# Patient Record
Sex: Female | Born: 1950 | Race: White | Hispanic: No | Smoking: Current every day smoker
Health system: Southern US, Community
[De-identification: ages and names within clinical notes are randomized; demographics above are authoritative.]

## PROBLEM LIST (undated history)

## (undated) DIAGNOSIS — E785 Hyperlipidemia, unspecified: Secondary | ICD-10-CM

## (undated) DIAGNOSIS — K5790 Diverticulosis of intestine, part unspecified, without perforation or abscess without bleeding: Secondary | ICD-10-CM

## (undated) DIAGNOSIS — I10 Essential (primary) hypertension: Secondary | ICD-10-CM

## (undated) DIAGNOSIS — E0500 Thyrotoxicosis with diffuse goiter without thyrotoxic crisis or storm: Secondary | ICD-10-CM

## (undated) DIAGNOSIS — K635 Polyp of colon: Secondary | ICD-10-CM

## (undated) DIAGNOSIS — Z87442 Personal history of urinary calculi: Secondary | ICD-10-CM

## (undated) DIAGNOSIS — K633 Ulcer of intestine: Secondary | ICD-10-CM

## (undated) DIAGNOSIS — N809 Endometriosis, unspecified: Secondary | ICD-10-CM

## (undated) DIAGNOSIS — B029 Zoster without complications: Secondary | ICD-10-CM

## (undated) DIAGNOSIS — E039 Hypothyroidism, unspecified: Secondary | ICD-10-CM

## (undated) DIAGNOSIS — K219 Gastro-esophageal reflux disease without esophagitis: Secondary | ICD-10-CM

## (undated) DIAGNOSIS — E119 Type 2 diabetes mellitus without complications: Secondary | ICD-10-CM

## (undated) DIAGNOSIS — D510 Vitamin B12 deficiency anemia due to intrinsic factor deficiency: Secondary | ICD-10-CM

## (undated) DIAGNOSIS — M8580 Other specified disorders of bone density and structure, unspecified site: Secondary | ICD-10-CM

## (undated) DIAGNOSIS — G43909 Migraine, unspecified, not intractable, without status migrainosus: Secondary | ICD-10-CM

## (undated) DIAGNOSIS — N200 Calculus of kidney: Secondary | ICD-10-CM

## (undated) DIAGNOSIS — F419 Anxiety disorder, unspecified: Secondary | ICD-10-CM

## (undated) HISTORY — PX: OTHER SURGICAL HISTORY: SHX169

## (undated) HISTORY — DX: Migraine, unspecified, not intractable, without status migrainosus: G43.909

## (undated) HISTORY — DX: Other specified disorders of bone density and structure, unspecified site: M85.80

## (undated) HISTORY — DX: Endometriosis, unspecified: N80.9

## (undated) HISTORY — PX: ABDOMINAL HYSTERECTOMY: SHX81

## (undated) HISTORY — DX: Gastro-esophageal reflux disease without esophagitis: K21.9

## (undated) HISTORY — DX: Vitamin B12 deficiency anemia due to intrinsic factor deficiency: D51.0

## (undated) HISTORY — PX: ROTATOR CUFF REPAIR: SHX139

## (undated) HISTORY — DX: Zoster without complications: B02.9

## (undated) HISTORY — DX: Ulcer of intestine: K63.3

## (undated) HISTORY — DX: Diverticulosis of intestine, part unspecified, without perforation or abscess without bleeding: K57.90

## (undated) HISTORY — DX: Anxiety disorder, unspecified: F41.9

## (undated) HISTORY — DX: Hyperlipidemia, unspecified: E78.5

## (undated) HISTORY — PX: EYE MUSCLE SURGERY: SHX370

## (undated) HISTORY — DX: Polyp of colon: K63.5

## (undated) HISTORY — PX: LITHOTRIPSY: SUR834

---

## 2000-08-14 ENCOUNTER — Encounter: Admission: RE | Admit: 2000-08-14 | Discharge: 2000-08-14 | Payer: Self-pay | Admitting: Family Medicine

## 2000-08-14 ENCOUNTER — Encounter: Payer: Self-pay | Admitting: Family Medicine

## 2000-08-16 ENCOUNTER — Encounter: Payer: Self-pay | Admitting: Family Medicine

## 2000-08-16 ENCOUNTER — Encounter: Admission: RE | Admit: 2000-08-16 | Discharge: 2000-08-16 | Payer: Self-pay | Admitting: Family Medicine

## 2001-02-25 ENCOUNTER — Encounter (INDEPENDENT_AMBULATORY_CARE_PROVIDER_SITE_OTHER): Payer: Self-pay | Admitting: *Deleted

## 2001-02-25 ENCOUNTER — Encounter: Payer: Self-pay | Admitting: Emergency Medicine

## 2001-02-25 ENCOUNTER — Emergency Department (HOSPITAL_COMMUNITY): Admission: EM | Admit: 2001-02-25 | Discharge: 2001-02-25 | Payer: Self-pay | Admitting: Emergency Medicine

## 2001-03-21 ENCOUNTER — Ambulatory Visit (HOSPITAL_COMMUNITY): Admission: RE | Admit: 2001-03-21 | Discharge: 2001-03-21 | Payer: Self-pay | Admitting: Family Medicine

## 2001-03-21 ENCOUNTER — Encounter (INDEPENDENT_AMBULATORY_CARE_PROVIDER_SITE_OTHER): Payer: Self-pay | Admitting: *Deleted

## 2001-03-21 ENCOUNTER — Encounter: Payer: Self-pay | Admitting: Family Medicine

## 2002-06-09 ENCOUNTER — Encounter: Admission: RE | Admit: 2002-06-09 | Discharge: 2002-09-07 | Payer: Self-pay | Admitting: Orthopedic Surgery

## 2002-10-23 ENCOUNTER — Encounter: Payer: Self-pay | Admitting: Family Medicine

## 2002-10-23 ENCOUNTER — Encounter (INDEPENDENT_AMBULATORY_CARE_PROVIDER_SITE_OTHER): Payer: Self-pay | Admitting: *Deleted

## 2002-10-23 ENCOUNTER — Ambulatory Visit (HOSPITAL_COMMUNITY): Admission: RE | Admit: 2002-10-23 | Discharge: 2002-10-23 | Payer: Self-pay | Admitting: Family Medicine

## 2002-11-19 ENCOUNTER — Other Ambulatory Visit: Admission: RE | Admit: 2002-11-19 | Discharge: 2002-11-19 | Payer: Self-pay | Admitting: Family Medicine

## 2003-12-03 ENCOUNTER — Emergency Department (HOSPITAL_COMMUNITY): Admission: EM | Admit: 2003-12-03 | Discharge: 2003-12-03 | Payer: Self-pay | Admitting: Emergency Medicine

## 2004-12-20 ENCOUNTER — Other Ambulatory Visit: Admission: RE | Admit: 2004-12-20 | Discharge: 2004-12-20 | Payer: Self-pay | Admitting: Family Medicine

## 2008-05-09 ENCOUNTER — Emergency Department: Payer: Self-pay | Admitting: Emergency Medicine

## 2009-07-17 ENCOUNTER — Encounter (INDEPENDENT_AMBULATORY_CARE_PROVIDER_SITE_OTHER): Payer: Self-pay | Admitting: *Deleted

## 2009-07-17 ENCOUNTER — Encounter: Payer: Self-pay | Admitting: Internal Medicine

## 2009-07-17 ENCOUNTER — Encounter: Admission: RE | Admit: 2009-07-17 | Discharge: 2009-07-17 | Payer: Self-pay | Admitting: Family Medicine

## 2009-07-21 ENCOUNTER — Ambulatory Visit: Payer: Self-pay | Admitting: Internal Medicine

## 2009-07-21 DIAGNOSIS — E079 Disorder of thyroid, unspecified: Secondary | ICD-10-CM | POA: Insufficient documentation

## 2009-07-21 DIAGNOSIS — E0500 Thyrotoxicosis with diffuse goiter without thyrotoxic crisis or storm: Secondary | ICD-10-CM | POA: Insufficient documentation

## 2009-07-21 DIAGNOSIS — I10 Essential (primary) hypertension: Secondary | ICD-10-CM | POA: Insufficient documentation

## 2009-07-21 DIAGNOSIS — R197 Diarrhea, unspecified: Secondary | ICD-10-CM | POA: Insufficient documentation

## 2009-07-21 DIAGNOSIS — R143 Flatulence: Secondary | ICD-10-CM

## 2009-07-21 DIAGNOSIS — R142 Eructation: Secondary | ICD-10-CM

## 2009-07-21 DIAGNOSIS — R1031 Right lower quadrant pain: Secondary | ICD-10-CM

## 2009-07-21 DIAGNOSIS — R141 Gas pain: Secondary | ICD-10-CM

## 2009-07-21 DIAGNOSIS — E785 Hyperlipidemia, unspecified: Secondary | ICD-10-CM | POA: Insufficient documentation

## 2009-07-21 DIAGNOSIS — Z87442 Personal history of urinary calculi: Secondary | ICD-10-CM

## 2009-07-23 ENCOUNTER — Ambulatory Visit (HOSPITAL_COMMUNITY): Admission: RE | Admit: 2009-07-23 | Discharge: 2009-07-23 | Payer: Self-pay | Admitting: Internal Medicine

## 2009-07-23 ENCOUNTER — Encounter: Payer: Self-pay | Admitting: Internal Medicine

## 2009-07-23 HISTORY — PX: COLONOSCOPY: SHX174

## 2009-07-26 ENCOUNTER — Telehealth: Payer: Self-pay | Admitting: Internal Medicine

## 2009-07-26 ENCOUNTER — Encounter: Payer: Self-pay | Admitting: Internal Medicine

## 2009-08-19 ENCOUNTER — Ambulatory Visit: Payer: Self-pay | Admitting: Internal Medicine

## 2009-08-19 DIAGNOSIS — B029 Zoster without complications: Secondary | ICD-10-CM | POA: Insufficient documentation

## 2009-08-19 DIAGNOSIS — D126 Benign neoplasm of colon, unspecified: Secondary | ICD-10-CM

## 2010-09-05 ENCOUNTER — Other Ambulatory Visit: Payer: Self-pay

## 2013-04-14 ENCOUNTER — Ambulatory Visit: Payer: 59 | Attending: Sports Medicine | Admitting: Physical Therapy

## 2013-04-14 DIAGNOSIS — IMO0001 Reserved for inherently not codable concepts without codable children: Secondary | ICD-10-CM | POA: Insufficient documentation

## 2013-04-14 DIAGNOSIS — M545 Low back pain, unspecified: Secondary | ICD-10-CM | POA: Insufficient documentation

## 2013-04-14 DIAGNOSIS — M25559 Pain in unspecified hip: Secondary | ICD-10-CM | POA: Insufficient documentation

## 2013-04-16 ENCOUNTER — Ambulatory Visit: Payer: 59 | Attending: Sports Medicine | Admitting: Physical Therapy

## 2013-04-16 DIAGNOSIS — M25559 Pain in unspecified hip: Secondary | ICD-10-CM | POA: Insufficient documentation

## 2013-04-16 DIAGNOSIS — IMO0001 Reserved for inherently not codable concepts without codable children: Secondary | ICD-10-CM | POA: Insufficient documentation

## 2013-04-16 DIAGNOSIS — M545 Low back pain, unspecified: Secondary | ICD-10-CM | POA: Insufficient documentation

## 2013-11-13 ENCOUNTER — Other Ambulatory Visit: Payer: Self-pay | Admitting: Urology

## 2013-11-13 ENCOUNTER — Other Ambulatory Visit (HOSPITAL_COMMUNITY): Payer: Self-pay | Admitting: Urology

## 2013-11-13 ENCOUNTER — Encounter (HOSPITAL_COMMUNITY): Payer: Self-pay | Admitting: Pharmacy Technician

## 2013-11-13 NOTE — Patient Instructions (Addendum)
20 Heather Bentley  11/13/2013   Your procedure is scheduled on: Monday February 2nd  Report to [place] at 530 AM.  Call this number if you have problems the morning of surgery [phone]   Remember: NO VISITORS UNDER AGE 63 PER [place].   Do not eat food or drink liquids :After Midnight.     Take these medicines the morning of surgery with A SIP OF WATER: synthroid, eye drop, nicoderm patch                                SEE [place] PREPARING FOR SURGERY SHEET             You may not have any metal on your body including hair pins and piercings  Do not wear jewelry, make-up.  Do not wear lotions, powders, or perfumes. No  Deodorant is to be worn.   Men may shave face and neck.  Do not bring valuables to the hospital. [place].  Contacts, dentures or bridgework may not be worn into surgery.  Leave suitcase in the car. After surgery it may be brought to your room.  For patients admitted to the hospital, checkout time is 11:00 AM the day of discharge.   Patients discharged the day of surgery will not be allowed to drive home.  Name and phone number of your driver: [name] spouse cell [phone]  Special Instructions: N/A  Please read over the following fact sheets that you were given: [facility] Preparing for surgery sheet  Call [name] RN pre op nurse if needed [phone]    [place].  PATIENT SIGNATURE___________________________________________  NURSE SIGNATURE_____________________________________________

## 2013-11-14 ENCOUNTER — Encounter (HOSPITAL_COMMUNITY): Payer: Self-pay

## 2013-11-14 ENCOUNTER — Ambulatory Visit (HOSPITAL_COMMUNITY)
Admission: RE | Admit: 2013-11-14 | Discharge: 2013-11-14 | Disposition: A | Discharge status: Home / Self Care | Payer: 59 | Source: Ambulatory Visit | Attending: Urology | Admitting: Urology

## 2013-11-14 ENCOUNTER — Encounter (HOSPITAL_COMMUNITY)
Admission: RE | Admit: 2013-11-14 | Discharge: 2013-11-14 | Disposition: A | Discharge status: Home / Self Care | Payer: 59 | Source: Ambulatory Visit | Attending: Urology | Admitting: Urology

## 2013-11-14 DIAGNOSIS — Z01818 Encounter for other preprocedural examination: Secondary | ICD-10-CM | POA: Insufficient documentation

## 2013-11-14 DIAGNOSIS — Z01810 Encounter for preprocedural cardiovascular examination: Secondary | ICD-10-CM | POA: Insufficient documentation

## 2013-11-14 DIAGNOSIS — Z01812 Encounter for preprocedural laboratory examination: Secondary | ICD-10-CM | POA: Insufficient documentation

## 2013-11-14 HISTORY — DX: Hypothyroidism, unspecified: E03.9

## 2013-11-14 HISTORY — DX: Type 2 diabetes mellitus without complications: E11.9

## 2013-11-14 HISTORY — DX: Thyrotoxicosis with diffuse goiter without thyrotoxic crisis or storm: E05.00

## 2013-11-14 HISTORY — DX: Essential (primary) hypertension: I10

## 2013-11-14 HISTORY — DX: Personal history of urinary calculi: Z87.442

## 2013-11-14 HISTORY — DX: Calculus of kidney: N20.0

## 2013-11-14 LAB — CBC
HCT: 42.4 % (ref 36.0–46.0)
Hemoglobin: 14 g/dL (ref 12.0–15.0)
MCH: 30 pg (ref 26.0–34.0)
MCHC: 33 g/dL (ref 30.0–36.0)
MCV: 90.8 fL (ref 78.0–100.0)
Platelets: 399 10*3/uL (ref 150–400)
RBC: 4.67 MIL/uL (ref 3.87–5.11)
RDW: 14 % (ref 11.5–15.5)
WBC: 9.7 10*3/uL (ref 4.0–10.5)

## 2013-11-14 LAB — BASIC METABOLIC PANEL
BUN: 19 mg/dL (ref 6–23)
CO2: 27 mEq/L (ref 19–32)
Calcium: 9.5 mg/dL (ref 8.4–10.5)
Chloride: 97 mEq/L (ref 96–112)
Creatinine, Ser: 0.46 mg/dL — ABNORMAL LOW (ref 0.50–1.10)
GFR calc Af Amer: >90 mL/min (ref >90)
GFR calc non Af Amer: >90 mL/min (ref >90)
Glucose, Bld: 96 mg/dL (ref 70–99)
Potassium: 3.7 mEq/L (ref 3.7–5.3)
Sodium: 140 mEq/L (ref 137–147)

## 2013-11-16 NOTE — Anesthesia Preprocedure Evaluation (Addendum)
Anesthesia Evaluation  Patient identified by MRN, date of birth, ID band Patient awake    Reviewed: Allergy & Precautions, H&P , NPO status , Patient's Chart, lab work & pertinent test results  Airway Mallampati: II TM Distance: >3 FB Neck ROM: full    Dental  (+) Edentulous Upper and Dental Advisory Given   Pulmonary neg pulmonary ROS, Current Smoker,  breath sounds clear to auscultation  Pulmonary exam normal       Cardiovascular Exercise Tolerance: Good hypertension, Pt. on medications Rhythm:regular Rate:Normal     Neuro/Psych negative neurological ROS  negative psych ROS   GI/Hepatic negative GI ROS, Neg liver ROS,   Endo/Other  diabetes, Well Controlled, Type 2Hypothyroidism Graves disease.  Diet controlled DM  Renal/GU negative Renal ROS  negative genitourinary   Musculoskeletal   Abdominal   Peds  Hematology negative hematology ROS (+)   Anesthesia Other Findings   Reproductive/Obstetrics negative OB ROS                         Anesthesia Physical Anesthesia Plan  ASA: III  Anesthesia Plan: General   Post-op Pain Management:    Induction: Intravenous  Airway Management Planned: LMA  Additional Equipment:   Intra-op Plan:   Post-operative Plan:   Informed Consent: I have reviewed the patients History and Physical, chart, labs and discussed the procedure including the risks, benefits and alternatives for the proposed anesthesia with the patient or authorized representative who has indicated his/her understanding and acceptance.   Dental Advisory Given  Plan Discussed with: CRNA and Surgeon  Anesthesia Plan Comments:         Anesthesia Quick Evaluation

## 2013-11-17 ENCOUNTER — Ambulatory Visit (HOSPITAL_COMMUNITY): Payer: 59

## 2013-11-17 ENCOUNTER — Ambulatory Visit (HOSPITAL_COMMUNITY): Payer: 59 | Admitting: Anesthesiology

## 2013-11-17 ENCOUNTER — Encounter (HOSPITAL_COMMUNITY)
Admission: RE | Disposition: A | Discharge status: Home / Self Care | Payer: Self-pay | Source: Ambulatory Visit | Attending: Urology

## 2013-11-17 ENCOUNTER — Ambulatory Visit (HOSPITAL_COMMUNITY)
Admission: RE | Admit: 2013-11-17 | Discharge: 2013-11-17 | Disposition: A | Discharge status: Home / Self Care | Payer: 59 | Source: Ambulatory Visit | Attending: Urology | Admitting: Urology

## 2013-11-17 ENCOUNTER — Encounter (HOSPITAL_COMMUNITY): Payer: 59 | Admitting: Anesthesiology

## 2013-11-17 ENCOUNTER — Encounter (HOSPITAL_COMMUNITY): Payer: Self-pay | Admitting: *Deleted

## 2013-11-17 DIAGNOSIS — E039 Hypothyroidism, unspecified: Secondary | ICD-10-CM | POA: Insufficient documentation

## 2013-11-17 DIAGNOSIS — E119 Type 2 diabetes mellitus without complications: Secondary | ICD-10-CM | POA: Insufficient documentation

## 2013-11-17 DIAGNOSIS — E0500 Thyrotoxicosis with diffuse goiter without thyrotoxic crisis or storm: Secondary | ICD-10-CM | POA: Insufficient documentation

## 2013-11-17 DIAGNOSIS — Z79899 Other long term (current) drug therapy: Secondary | ICD-10-CM | POA: Insufficient documentation

## 2013-11-17 DIAGNOSIS — Z90710 Acquired absence of both cervix and uterus: Secondary | ICD-10-CM | POA: Insufficient documentation

## 2013-11-17 DIAGNOSIS — I10 Essential (primary) hypertension: Secondary | ICD-10-CM | POA: Insufficient documentation

## 2013-11-17 DIAGNOSIS — N201 Calculus of ureter: Secondary | ICD-10-CM

## 2013-11-17 DIAGNOSIS — F17200 Nicotine dependence, unspecified, uncomplicated: Secondary | ICD-10-CM | POA: Insufficient documentation

## 2013-11-17 DIAGNOSIS — E7800 Pure hypercholesterolemia, unspecified: Secondary | ICD-10-CM | POA: Insufficient documentation

## 2013-11-17 HISTORY — PX: HOLMIUM LASER APPLICATION: SHX5852

## 2013-11-17 HISTORY — PX: CYSTOSCOPY WITH URETEROSCOPY: SHX5123

## 2013-11-17 LAB — GLUCOSE, CAPILLARY
Glucose-Capillary: 116 mg/dL — ABNORMAL HIGH (ref 70–99)
Glucose-Capillary: 123 mg/dL — ABNORMAL HIGH (ref 70–99)

## 2013-11-17 SURGERY — CYSTOSCOPY WITH URETEROSCOPY
Anesthesia: General | Laterality: Right

## 2013-11-17 MED ORDER — FENTANYL CITRATE 0.05 MG/ML IJ SOLN
INTRAMUSCULAR | Status: AC
  Filled 2013-11-17: qty 5

## 2013-11-17 MED ORDER — EPHEDRINE SULFATE 50 MG/ML IJ SOLN
INTRAMUSCULAR | Status: AC
  Filled 2013-11-17: qty 1

## 2013-11-17 MED ORDER — OXYCODONE HCL 5 MG PO TABS
10.0000 mg | ORAL_TABLET | Freq: Once | ORAL | Status: AC
  Administered 2013-11-17: 10 mg via ORAL
  Filled 2013-11-17: qty 2

## 2013-11-17 MED ORDER — ONDANSETRON HCL 4 MG/2ML IJ SOLN
INTRAMUSCULAR | Status: AC
  Filled 2013-11-17: qty 2

## 2013-11-17 MED ORDER — LIDOCAINE HCL (CARDIAC) 20 MG/ML IV SOLN
INTRAVENOUS | Status: AC
  Filled 2013-11-17: qty 5

## 2013-11-17 MED ORDER — PROPOFOL 10 MG/ML IV BOLUS
INTRAVENOUS | Status: AC
  Filled 2013-11-17: qty 20

## 2013-11-17 MED ORDER — FENTANYL CITRATE 0.05 MG/ML IJ SOLN: 25.0000 ug | INTRAMUSCULAR | Status: DC | PRN

## 2013-11-17 MED ORDER — CEFAZOLIN SODIUM-DEXTROSE 2-3 GM-% IV SOLR
2.0000 g | INTRAVENOUS | Status: AC
  Administered 2013-11-17: 2 g via INTRAVENOUS

## 2013-11-17 MED ORDER — ONDANSETRON HCL 4 MG/2ML IJ SOLN
INTRAMUSCULAR | Status: DC | PRN
  Administered 2013-11-17: 4 mg via INTRAVENOUS

## 2013-11-17 MED ORDER — OXYBUTYNIN CHLORIDE 5 MG PO TABS
ORAL_TABLET | ORAL | Status: AC
  Filled 2013-11-17: qty 1

## 2013-11-17 MED ORDER — PHENAZOPYRIDINE HCL 200 MG PO TABS
200.0000 mg | ORAL_TABLET | Freq: Once | ORAL | Status: AC
  Administered 2013-11-17: 200 mg via ORAL

## 2013-11-17 MED ORDER — LACTATED RINGERS IV SOLN: INTRAVENOUS | Status: DC

## 2013-11-17 MED ORDER — PHENAZOPYRIDINE HCL 200 MG PO TABS: 200.0000 mg | ORAL_TABLET | Freq: Three times a day (TID) | ORAL | Status: DC | PRN

## 2013-11-17 MED ORDER — MIDAZOLAM HCL 2 MG/2ML IJ SOLN
INTRAMUSCULAR | Status: AC
  Filled 2013-11-17: qty 2

## 2013-11-17 MED ORDER — FENTANYL CITRATE 0.05 MG/ML IJ SOLN
INTRAMUSCULAR | Status: DC | PRN
  Administered 2013-11-17: 50 ug via INTRAVENOUS
  Administered 2013-11-17 (×2): 25 ug via INTRAVENOUS

## 2013-11-17 MED ORDER — PHENAZOPYRIDINE HCL 200 MG PO TABS
ORAL_TABLET | ORAL | Status: AC
  Filled 2013-11-17: qty 1

## 2013-11-17 MED ORDER — OXYBUTYNIN CHLORIDE 5 MG PO TABS
5.0000 mg | ORAL_TABLET | Freq: Once | ORAL | Status: AC
  Administered 2013-11-17: 5 mg via ORAL

## 2013-11-17 MED ORDER — LIDOCAINE HCL (CARDIAC) 20 MG/ML IV SOLN
INTRAVENOUS | Status: DC | PRN
Start: 2013-11-17 — End: 2013-11-17
  Administered 2013-11-17: 70 mg via INTRAVENOUS

## 2013-11-17 MED ORDER — HYDROMORPHONE HCL PF 1 MG/ML IJ SOLN: 0.5000 mg | INTRAMUSCULAR | Status: DC | PRN

## 2013-11-17 MED ORDER — SODIUM CHLORIDE 0.9 % IJ SOLN
INTRAMUSCULAR | Status: AC
  Filled 2013-11-17: qty 10

## 2013-11-17 MED ORDER — PROPOFOL 10 MG/ML IV BOLUS
INTRAVENOUS | Status: DC | PRN
  Administered 2013-11-17: 170 mg via INTRAVENOUS

## 2013-11-17 MED ORDER — TAMSULOSIN HCL 0.4 MG PO CAPS
0.4000 mg | ORAL_CAPSULE | Freq: Once | ORAL | Status: AC
  Administered 2013-11-17: 0.4 mg via ORAL
  Filled 2013-11-17: qty 1

## 2013-11-17 MED ORDER — CEFAZOLIN SODIUM-DEXTROSE 2-3 GM-% IV SOLR
INTRAVENOUS | Status: AC
  Filled 2013-11-17: qty 50

## 2013-11-17 MED ORDER — SODIUM CHLORIDE 0.9 % IR SOLN
Status: DC | PRN
  Administered 2013-11-17: 3000 mL

## 2013-11-17 MED ORDER — LACTATED RINGERS IV SOLN
INTRAVENOUS | Status: DC | PRN
  Administered 2013-11-17: 07:00:00 via INTRAVENOUS

## 2013-11-17 MED ORDER — MIDAZOLAM HCL 5 MG/5ML IJ SOLN
INTRAMUSCULAR | Status: DC | PRN
  Administered 2013-11-17: 2 mg via INTRAVENOUS

## 2013-11-17 SURGICAL SUPPLY — 22 items
BAG URO CATCHER STRL LF (DRAPE) ×2 IMPLANT
BASKET LASER NITINOL 1.9FR (BASKET) ×1 IMPLANT
BASKET ZERO TIP NITINOL 2.4FR (BASKET) ×1 IMPLANT
BSKT STON RTRVL 120 1.9FR (BASKET) ×1
BSKT STON RTRVL ZERO TP 2.4FR (BASKET) ×1
CATH INTERMIT  6FR 70CM (CATHETERS) ×2 IMPLANT
CATH URET 5FR 28IN CONE TIP (BALLOONS)
CATH URET 5FR 70CM CONE TIP (BALLOONS) IMPLANT
CLOTH BEACON ORANGE TIMEOUT ST (SAFETY) ×2 IMPLANT
DRAPE CAMERA CLOSED 9X96 (DRAPES) ×2 IMPLANT
FIBER LASER FLEXIVA 200 (UROLOGICAL SUPPLIES) ×1 IMPLANT
GLOVE BIOGEL M 8.0 STRL (GLOVE) ×4 IMPLANT
GLOVE SURG SS PI 8.0 STRL IVOR (GLOVE) ×1 IMPLANT
GOWN STRL REUS W/ TWL XL LVL3 (GOWN DISPOSABLE) ×1 IMPLANT
GOWN STRL REUS W/TWL XL LVL3 (GOWN DISPOSABLE) ×3 IMPLANT
GUIDEWIRE STR DUAL SENSOR (WIRE) ×2 IMPLANT
MANIFOLD NEPTUNE II (INSTRUMENTS) ×2 IMPLANT
MARKER SKIN DUAL TIP RULER LAB (MISCELLANEOUS) ×1 IMPLANT
PACK CYSTO (CUSTOM PROCEDURE TRAY) ×2 IMPLANT
SHEATH URET ACCESS 12FR/35CM (UROLOGICAL SUPPLIES) ×1 IMPLANT
STENT CONTOUR 6FRX24X.038 (STENTS) ×1 IMPLANT
TUBING CONNECTING 10 (TUBING) ×2 IMPLANT

## 2013-11-17 NOTE — Preoperative (Signed)
Beta Blockers   Reason not to administer Beta Blockers:Not Applicable 

## 2013-11-17 NOTE — H&P (Signed)
Heather Bentley is a 63 year old female patient of Dr. Lisbeth Ply who was seen in office consultation for hematuria and a right ureteral calculus.   History of Present Illness Nephrolithiasis: A CT scan done 11/10/13 revealed a 1-2 millimeter stone in the right kidney and a 3 mm stone in the left kidney as well as a 6 mm stone in the distal right ureter with Hounsfield units of ~700.  She had been experiencing some dysuria and then had gross hematuria. This began in 11/14. The hematuria cleared but she continued to have frequency and a pressure-type sensation and thought she might have a UTI. She continues to have intermittent, mild right flank pain that has been controlled with Tylenol only. She has passed a stone over 20 years ago and her father has had kidney stones. She is not having any further hematuria. The discomfort she experiences is not relieved by any form of positional change.     Past Medical History Problems  1. History of diabetes mellitus (V12.29) 2. History of hypercholesterolemia (V12.29) 3. History of hypertension (V12.59) 4. History of hypothyroidism (V12.29)  Surgical History Problems  1. History of Eye Surgery 2. History of Hysterectomy 3. History of Shoulder Surgery  Current Meds 1. AmLODIPine Besylate 5 MG Oral Tablet;  Therapy: [phone] to Recorded 2. Losartan Potassium-HCTZ 100-25 MG Oral Tablet;  Therapy: [id number] to Recorded 3. Synthroid 125 MCG Oral Tablet;  Therapy: [id number] to Recorded  Allergies Medication  1. Cipro TABS Non-Medication  2. Contrast Dye 3. Shellfish  Family History Problems  1. Family history of kidney stones (V18.69) : Father 2. Family history of Hematuria : Father  Social History Problems    Denied: History of Alcohol use   Caffeine use (V49.89)   Current every day smoker (305.1)   Death in the family, father   Occupation   Three children  Review of Systems Genitourinary, constitutional, skin, eye, otolaryngeal,  hematologic/lymphatic, cardiovascular, pulmonary, endocrine, musculoskeletal, gastrointestinal, neurological and psychiatric system(s) were reviewed and pertinent findings if present are noted.  Genitourinary: urinary frequency, dysuria, nocturia and hematuria.  Constitutional: feeling tired (fatigue).  Hematologic/Lymphatic: a tendency to easily bruise.    Vitals Vital Signs Height: 5 ft  Weight: 140 lb  BMI Calculated: 27.34 BSA Calculated: 1.6 Blood Pressure: 135 / 80 Temperature: 97.3 F Heart Rate: 93  Physical Exam Constitutional: Well nourished and well developed . No acute distress.   ENT:. The ears and nose are normal in appearance.   Neck: The appearance of the neck is normal and no neck mass is present.   Pulmonary: No respiratory distress and normal respiratory rhythm and effort.   Cardiovascular: Heart rate and rhythm are normal . No peripheral edema.   Abdomen: The abdomen is soft and nontender. No masses are palpated. No CVA tenderness. No hernias are palpable. No hepatosplenomegaly noted.   Lymphatics: The femoral and inguinal nodes are not enlarged or tender.   Skin: Normal skin turgor, no visible rash and no visible skin lesions.   Neuro/Psych:. Mood and affect are appropriate.    Results/Data  Notes from Dr. Lisbeth Ply as above.  The following images/tracing/specimen were independently visualized:  CT scan as above.  The following clinical lab reports were reviewed:  UA:.  The following radiology reports were reviewed: CT scan.    Assessment  We discussed the management of urinary stones. These options include observation, ureteroscopy and shockwave lithotripsy. We discussed which options are relevant to these particular stones. We discussed the natural history of stones  as well as the complications of untreated stones and the impact on quality of life without treatment as well as with each of the above listed treatments. We also discussed the efficacy of  each treatment in its ability to clear the stone burden. With any of these management options I discussed the signs and symptoms of infection and the need for emergent treatment should these be experienced. For each option we discussed the ability of each procedure to clear the patient of their stone burden.    For observation I described the risks which include but are not limited to silent renal damage, life-threatening infection, need for emergent surgery, failure to pass stone, and pain.    For ureteroscopy I described the risks which include heart attack, stroke, pulmonary embolus, death, bleeding, infection, damage to contiguous structures, positioning injury, ureteral stricture, ureteral avulsion, ureteral injury, need for ureteral stent, inability to perform ureteroscopy, need for an interval procedure, inability to clear stone burden, stent discomfort and pain.    For shockwave lithotripsy I described the risks which include arrhythmia, kidney contusion, kidney hemorrhage, need for transfusion, long-term risk of diabetes or hypertension, back discomfort, flank ecchymosis, flank abrasion, inability to break up stone, inability to pass stone fragments, Steinstrasse, infection associated with obstructing stones, need for different surgical procedure, need for repeat shockwave lithotripsy, and death.    She has elected to proceed with ureteroscopic treatment of her stone.   Plan   1. I've given her prescription for oxycodone K she needs it.  2. I have begun medical expulsive therapy with tamsulosin.  3. She will be scheduled for right ureteroscopy and laser lithotripsy with possible stent placement as an outpatient.

## 2013-11-17 NOTE — Progress Notes (Signed)
String taped to Lt inner thigh. Patient instructed not to pull it.

## 2013-11-17 NOTE — Transfer of Care (Signed)
Immediate Anesthesia Transfer of Care Note  Patient: Heather Bentley  Procedure(s) Performed: Procedure(s): CYSTOSCOPY WITH URETEROSCOPY WITH RIGHT STENT PLACEMENT (Right) HOLMIUM LASER APPLICATION (Right)  Patient Location: PACU  Anesthesia Type:General  Level of Consciousness: awake, alert , oriented and patient cooperative  Airway & Oxygen Therapy: Patient Spontanous Breathing and Patient connected to face mask oxygen  Post-op Assessment: Report given to PACU RN, Post -op Vital signs reviewed and stable and Patient moving all extremities  Post vital signs: Reviewed  nd stable  Complications: No apparent anesthesia complications

## 2013-11-17 NOTE — Op Note (Signed)
PATIENT:  Heather Bentley  PRE-OPERATIVE DIAGNOSIS:  right Ureteral calculus  POST-OPERATIVE DIAGNOSIS: Same  PROCEDURE:  1. Cystoscopy with right retrograde pyelogram including interpretation. 2. Right ureteroscopy and laser lithotripsy. 3. Right ureteroscopic stone extraction. 4. Right double-J stent  SURGEON: Claybon Jabs, MD  INDICATION: Heather Bentley is a 63 year old female who experienced hematuria and was evaluated with a CT scan which revealed a 6 mm stone in the distal right ureter. She was having some mild voiding symptoms associated with this. It was not causing significant pain. We discussed the options for treatment and she elected to proceed with surgical management with ureteroscopy and laser lithotripsy. Preop KUB revealed the stone had not progressed and remained present in the distal right ureter.  ANESTHESIA:  General  EBL:  Minimal  DRAINS: 6 French, 24 cm double-J stent in the right ureter (with string)  SPECIMEN:  Stone given the patient  DESCRIPTION OF PROCEDURE: The patient was taken to the major OR and placed on the table. General anesthesia was administered and then the patient was moved to the dorsal lithotomy position. The genitalia was sterilely prepped and draped. An official timeout was performed.  Initially the 12 French cystoscope with 12 lens was passed under direct vision. The bladder was then entered and fully inspected. It was noted be free of any tumors stones or inflammatory lesions. Ureteral orifices were of normal configuration and position. A 6 French open-ended ureteral catheter was then passed through the cystoscope into the ureteral orifice in order to perform a right retrograde pyelogram.  A retrograde pyelogram was performed by injecting full-strength contrast up the right ureter under direct fluoroscopic control. It revealed a filling defect in the ureter consistent with the stone seen on the preoperative KUB. The remainder of the ureter  was noted to be normal as was the intrarenal collecting system. I then passed a 0.038 inch floppy-tipped guidewire through the open ended catheter and into the area of the renal pelvis and this was left in place. The inner portion of a ureteral access sheath was then passed over the guidewire to gently dilate the intramural ureter. I left the guidewire in place to serve as a safety guidewire. I then proceeded with ureteroscopy.  A 6 French rigid ureteroscope was then passed under direct into the bladder and into the right orifice and up the ureter. The stone was identified and I felt it was too large to extract and therefore elected to proceed with laser lithotripsy. The 200  holmium laser fiber was used to fragment the stone. I then used the nitinol basket to extract all of the stone fragments and reinspection of the ureter ureteroscopically revealed no further stone fragments and no injury to the ureter. I then backloaded the cystoscope over the guidewire and passed the stent over the guidewire into the area of the renal pelvis. As the guidewire was removed good curl was noted in the renal pelvis. The bladder was drained and the cystoscope was then removed. The patient tolerated the procedure well no intraoperative complications.  PLAN OF CARE: Discharge to home after PACU  PATIENT DISPOSITION:  PACU - hemodynamically stable.

## 2013-11-17 NOTE — Discharge Instructions (Signed)

## 2013-11-17 NOTE — Anesthesia Postprocedure Evaluation (Signed)
  Anesthesia Post-op Note  Patient: Heather Bentley  Procedure(s) Performed: Procedure(s) (LRB): CYSTOSCOPY WITH URETEROSCOPY WITH RIGHT STENT PLACEMENT (Right) HOLMIUM LASER APPLICATION (Right)  Patient Location: PACU  Anesthesia Type: General  Level of Consciousness: awake and alert   Airway and Oxygen Therapy: Patient Spontanous Breathing  Post-op Pain: mild  Post-op Assessment: Post-op Vital signs reviewed, Patient's Cardiovascular Status Stable, Respiratory Function Stable, Patent Airway and No signs of Nausea or vomiting  Last Vitals:  Filed Vitals:   11/17/13 0830  BP: 111/63  Pulse: 86  Temp:   Resp: 16    Post-op Vital Signs: stable   Complications: No apparent anesthesia complications

## 2013-11-18 ENCOUNTER — Encounter (HOSPITAL_COMMUNITY): Payer: Self-pay | Admitting: Urology

## 2014-07-31 ENCOUNTER — Ambulatory Visit: Payer: Self-pay | Admitting: Physician Assistant

## 2014-08-03 ENCOUNTER — Encounter: Payer: Self-pay | Admitting: *Deleted

## 2014-08-04 ENCOUNTER — Encounter: Payer: Self-pay | Admitting: Physician Assistant

## 2014-08-04 ENCOUNTER — Ambulatory Visit (INDEPENDENT_AMBULATORY_CARE_PROVIDER_SITE_OTHER): Payer: 59 | Admitting: Physician Assistant

## 2014-08-04 VITALS — BP 120/70 | HR 73 | Ht 60.0 in | Wt 150.0 lb

## 2014-08-04 DIAGNOSIS — B029 Zoster without complications: Secondary | ICD-10-CM

## 2014-08-04 DIAGNOSIS — R1031 Right lower quadrant pain: Secondary | ICD-10-CM

## 2014-08-04 MED ORDER — HYDROCODONE-ACETAMINOPHEN 5-325 MG PO TABS: 1.0000 | ORAL_TABLET | Freq: Four times a day (QID) | ORAL | Status: DC | PRN

## 2014-08-04 MED ORDER — ACYCLOVIR 800 MG PO TABS: 800.0000 mg | ORAL_TABLET | Freq: Every day | ORAL | Status: AC

## 2014-08-04 MED ORDER — METHYLPREDNISOLONE 4 MG PO KIT: PACK | ORAL | Status: DC

## 2014-08-04 NOTE — Progress Notes (Signed)
Subjective:    Patient ID: Heather Bentley, female    DOB: 07-03-1951, 63 y.o.   MRN: [id number]  HPI  Heather Bentley is a pleasant 63 year old white female known to Dr. Berdine Addison view was last seen here in 2010. She has history of Graves' disease, hypertension hyperlipidemia and history of kidney stones with prior stents. She had undergone colonoscopy in October of 2010 for complaints of right lower quarter pain. She was found to have a cecal ulcer which was biopsied and showed nonspecific inflammation she also had multiple small polyps which were hyperplastic. Initially he was thought perhaps the cecal ulcer was ischemic and that may have accounted for her right lower quadrant pain however about a week later apparently she developed atypical zoster rash which gradually resolved over the next couple of weeks. She says she hadn't had any problems until about 6 days ago. She says prior to that she had been under a lot of stress in September with her mother being in the hospital and then she developed bronchitis for which she's took a short course of antibiotics. She says she woke up last Monday morning early with a deep pain in her right lower quadrant which felt very similar to her previous episode. She says she also had pain around into the right flank and right back. She says that her skin has been sensitive and she been itching and scratching quite a bit but has not developed a rash. She's not had any fever or chills. No changes in her bowel habits no melena or hematochezia. Appetite has been fine and no changes in her pain with by mouth intake. Her pain is about the same as it onset. She says it's worse with lying down at night better with standing at times and is keeping her from sleeping most nights. She does have a prescription for Vicodin which helps a bit.  She had been seen by her PCP last week had CT scan of the abdomen and pelvis without IV contrast which was read as negative. She does have mild  diverticulosis with no diverticulitis and had stay single small nonobstructing stone in each kidney. Labs were also done with normal CBC , CMET and UA .    Review of Systems  Constitutional: Negative.   HENT: Negative.   Eyes: Negative.   Respiratory: Negative.   Cardiovascular: Negative.   Gastrointestinal: Positive for abdominal pain.  Endocrine: Negative.   Musculoskeletal: Positive for back pain.  Skin: Negative.   Allergic/Immunologic: Negative.   Neurological: Negative.   Hematological: Negative.   Psychiatric/Behavioral: Positive for sleep disturbance.   Outpatient Prescriptions Prior to Visit  Medication Sig Dispense Refill  . acetaminophen (TYLENOL) 500 MG tablet Take 1,000 mg by mouth every 6 (six) hours as needed.      Marland Kitchen amLODipine (NORVASC) 5 MG tablet Take 5 mg by mouth every evening.      Marland Kitchen levothyroxine (SYNTHROID, LEVOTHROID) 125 MCG tablet Take 125 mcg by mouth daily before breakfast.      . losartan-hydrochlorothiazide (HYZAAR) 100-25 MG per tablet Take 1 tablet by mouth every evening.      . Nicotine (NICODERM CQ TD) Place onto the skin daily.      . hydroxypropyl methylcellulose (ISOPTO TEARS) 2.5 % ophthalmic solution Place 1 drop into both eyes 3 (three) times daily as needed for dry eyes.      . phenazopyridine (PYRIDIUM) 200 MG tablet Take 1 tablet (200 mg total) by mouth 3 (three) times daily as  needed for pain.  30 tablet  0  . tamsulosin (FLOMAX) 0.4 MG CAPS capsule Take 0.4 mg by mouth daily after supper.       No facility-administered medications prior to visit.   Allergies  Allergen Reactions  . Adhesive [Tape]   . Norvasc [Amlodipine Besylate]     Insomnia   . Toprol Xl [Metoprolol Tartrate]   . Ciprofloxacin Hcl Itching and Rash  . Contrast Media [Iodinated Diagnostic Agents] Rash  . Shrimp [Shellfish Allergy] Rash   Patient Active Problem List   Diagnosis Date Noted  . 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 GRAVES' DISEASE 07/21/2009  . UNSPECIFIED DISORDER OF THYROID 07/21/2009  . HYPERLIPIDEMIA 07/21/2009  . HYPERTENSION 07/21/2009  . DIARRHEA 07/21/2009  . RENAL CALCULUS, HX OF 07/21/2009   History  Substance Use Topics  . Smoking status: Current Every Day Smoker -- 0.50 packs/day for 15 years  . Smokeless tobacco: Never Used  . Alcohol Use: Yes     Comment: very occasional wine      family history includes Macular degeneration in her mother; Stroke in her father. There is no history of Colon cancer.   Objective:   Physical Exam   Well-developed white female in no acute distress, pleasant blood pressure 120/70 pulse 73 height 5 foot weight 150. HEENT; nontraumatic normocephalic EOMI PERRLA sclera anicteric, Supple; no JVD, Cardiovascular; regular rate and rhythm with S1-S2 no murmur or gallop, Pulmonary; clear bilaterally, Abdomen ;soft, no real focal tenderness in the right lower quadrant she has some shallow bruising over the right lower quadrant and some excoriations along the right flank and into the back there is no rash or eruption, skin is sensitive, Rectal ;exam not done, Extremities; no clubbing cyanosis or edema skin warm dry, Psych ;mood and affect appropriate       Assessment & Plan:  #21 63 year old female with a 6 day history of constant right lower quadrant pain radiating around into the right back this is been associated with skin sensitivity) this. Patient has previous history of herpes zoster 5 years ago with very similar pain in the right lower quadrant. Workup last week with negative CT and labs. I suspect she has a recurrence of herpes zoster without rash. #2 mild diverticulosis #3 history of hyperplastic colon polyps last colonoscopy 2010 due for followup 2020  Plan; refill Vicodin 5/325 one every 4-6 hours as needed for pain #30 no refills Medrol Dosepak as instructed Zovirax 800 mg 5 times daily x7 days Patient is given a note for work for the remainder of  this week She will followup in 2 weeks with myself if her symptoms have not significantly improved She will followup with her PCP for Zostavax  vaccine once she is over this acute episode -which may help treat and further recurrences.

## 2014-08-04 NOTE — Patient Instructions (Signed)
We sent several prescriptions to [facility]. 1. Zovirax 800 mg 2. Medrol dose pack- take as directed. 3. Printed prescription for Hydrocodone 5/325/mg.  We have given you a note to be out of work until Mon 08-10-2014.  Please call us if you are not feeling better.

## 2014-08-05 NOTE — Progress Notes (Signed)
Reviewed, I am not sure if recurrent Herpes Zoster but it is better to be proactive and give steroid pack and Zovirax. Will see her if pain continues.

## 2014-08-18 ENCOUNTER — Ambulatory Visit: Payer: 59 | Admitting: Physician Assistant

## 2014-08-28 ENCOUNTER — Ambulatory Visit: Payer: 59 | Admitting: Physician Assistant

## 2014-08-28 ENCOUNTER — Telehealth: Payer: Self-pay | Admitting: *Deleted

## 2014-08-28 NOTE — Telephone Encounter (Signed)
I called and left a message to let Heather Bentley know that [name] did mention to her that if she was feeling much better she did not need to come in.  We hoped she was feeling better but urged her in my message to call us if she needs another appointment. We would be glad to see her.

## 2015-05-02 IMAGING — CT CT ABD-PELV W/O CM
2 of 4 series · 16 of 46 positions shown, 18 images · non-contrast
Comparison: None.

CLINICAL DATA: Right lower quadrant pain for 1 week with nausea and
vomiting.

EXAM:
CT ABDOMEN AND PELVIS WITHOUT CONTRAST
TECHNIQUE: Multidetector CT imaging of the abdomen and pelvis was performed
following the standard protocol without IV contrast.

[Series 2: routine abd pel wo · axial · 0.69mm/px · z∈[+388,+798]mm · 13 of 90 slices shown, 15 images]
[im 4/90  soft-tissue]
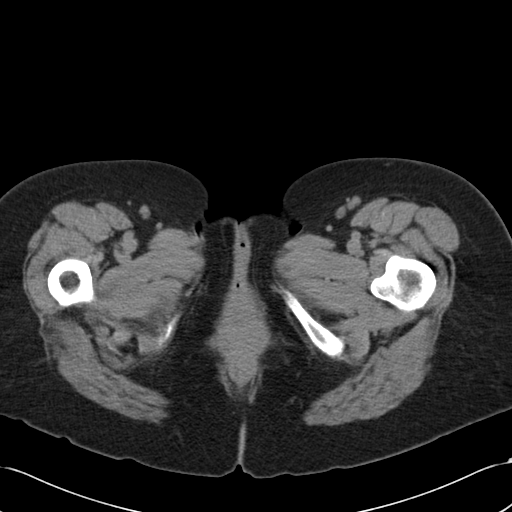
[im 4/90  bone]
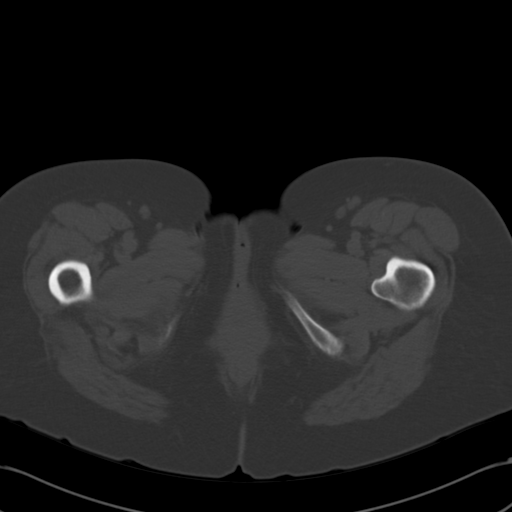
[im 12/90  soft-tissue]
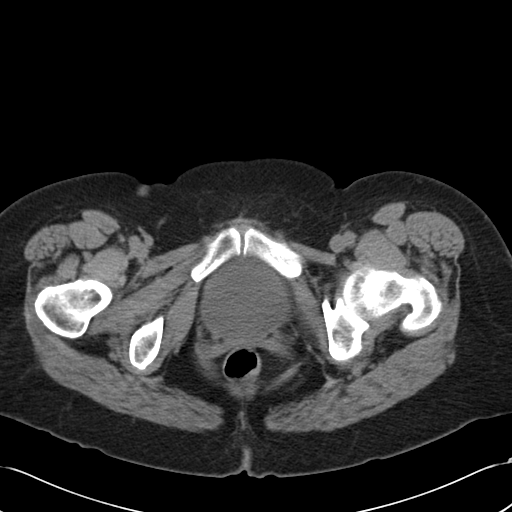
[im 19/90  soft-tissue]
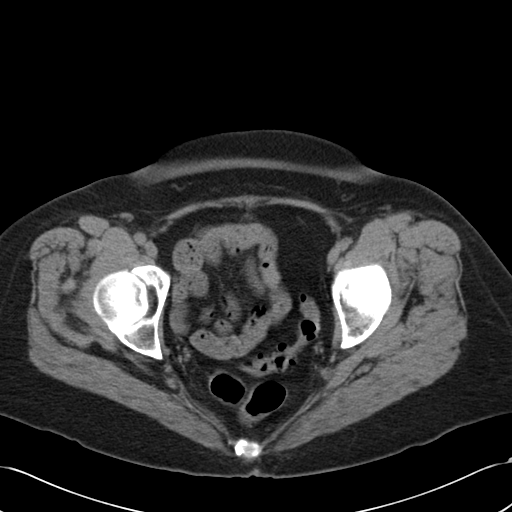
[im 26/90  soft-tissue]
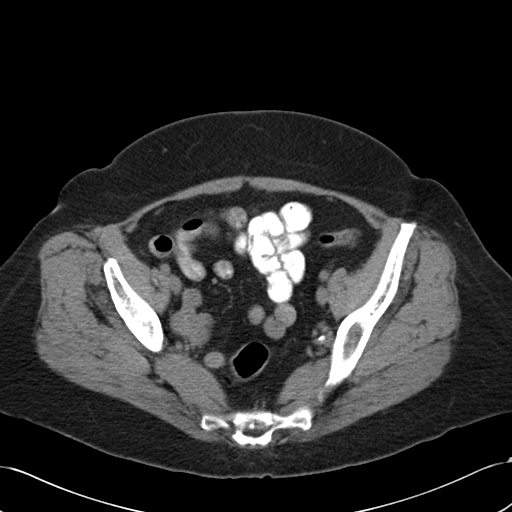
[im 30/90  soft-tissue]
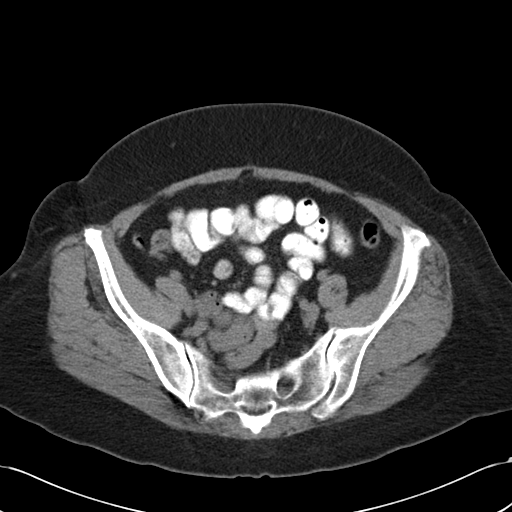
[im 38/90  soft-tissue]
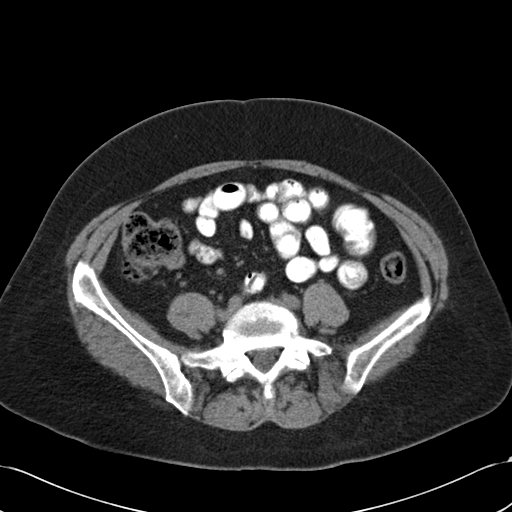
[im 45/90  soft-tissue]
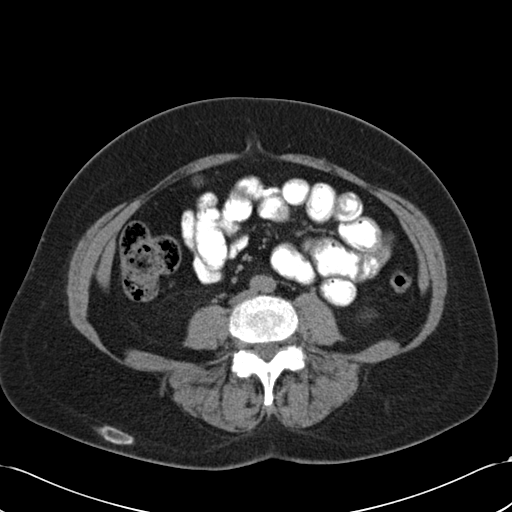
[im 52/90  soft-tissue]
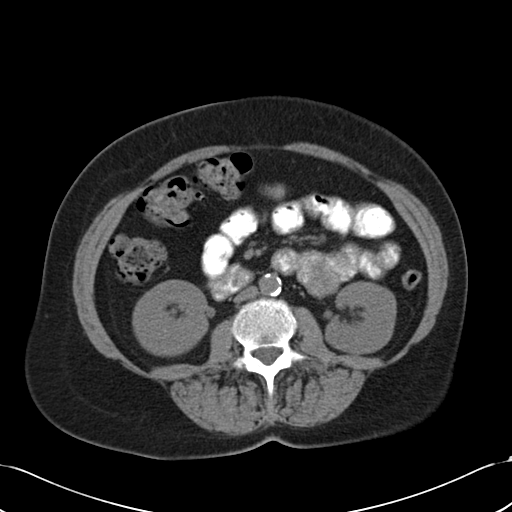
[im 60/90  soft-tissue]
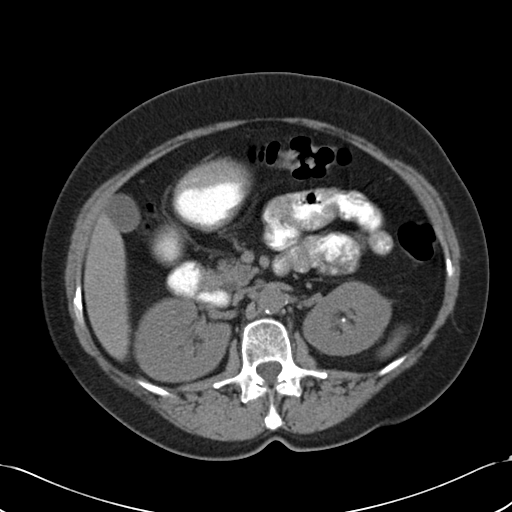
[im 60/90  bone]
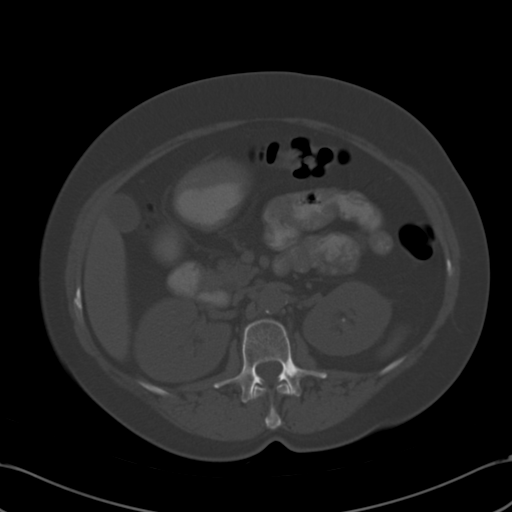
[im 64/90  soft-tissue]
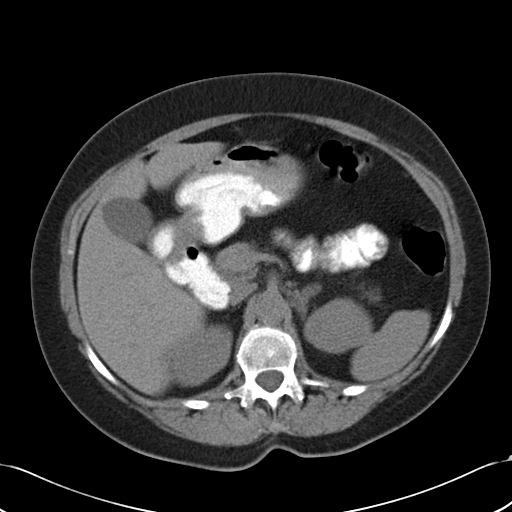
[im 71/90  soft-tissue]
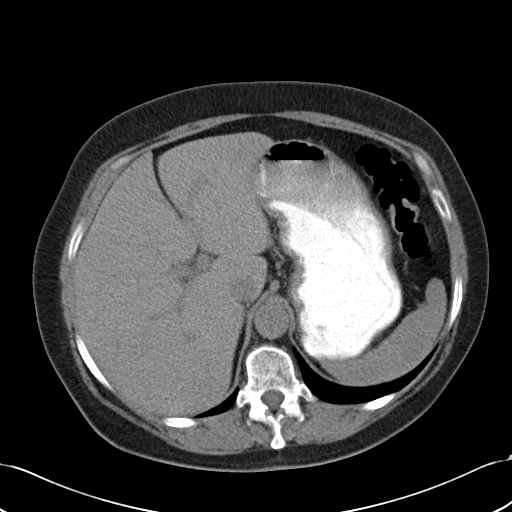
[im 78/90  soft-tissue]
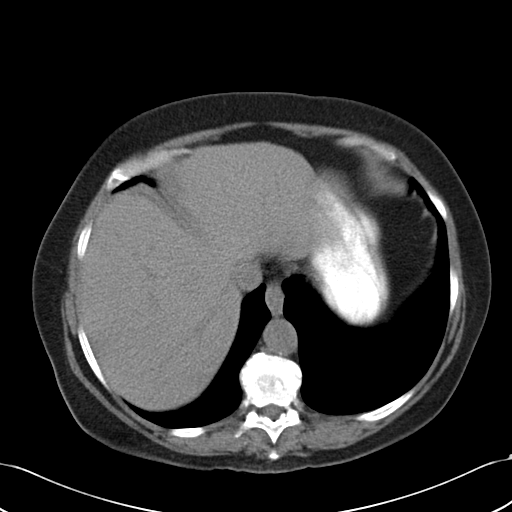
[im 86/90  soft-tissue]
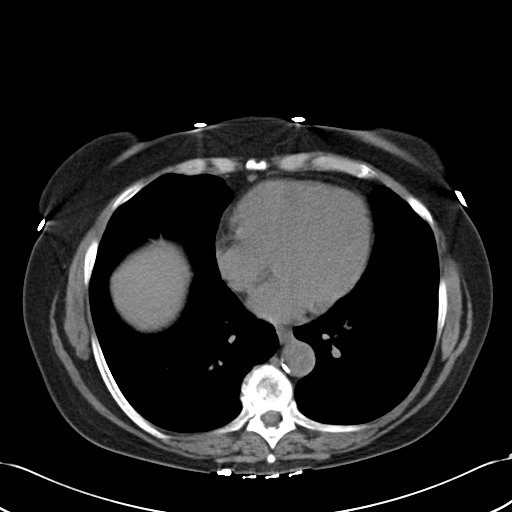

[Series 5: cor routine abd pel wo · coronal · 0.63mm/px · 3 of 130 slices shown]
[im 44/130  soft-tissue]
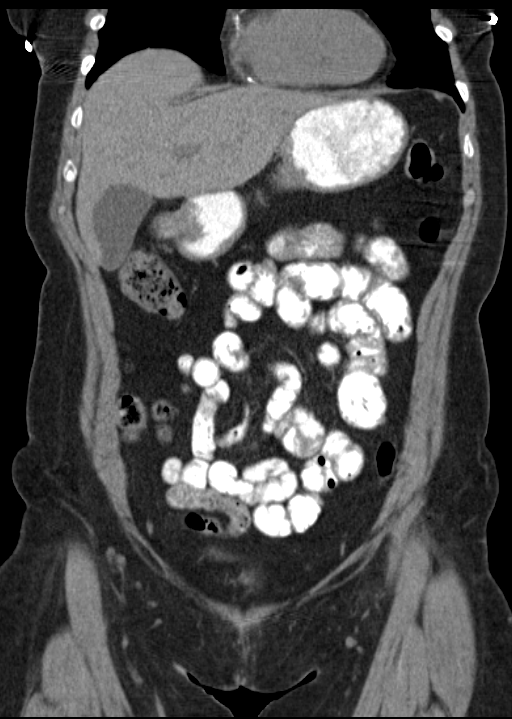
[im 58/130  soft-tissue]
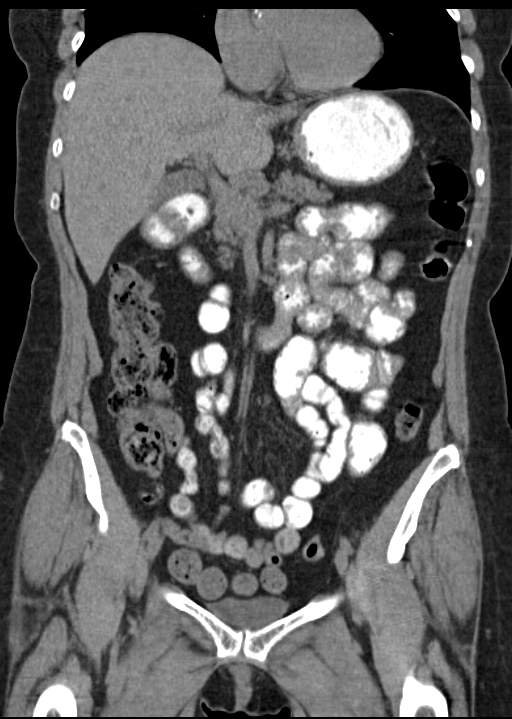
[im 72/130  soft-tissue]
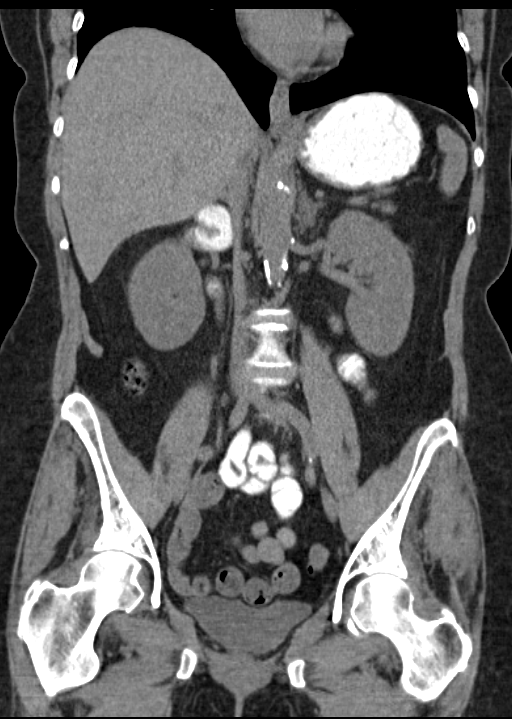

[16 of 46 positions shown; findings below may reference images not displayed]

FINDINGS: The lung bases are clear.  No pleural or pericardial effusion.

The gallbladder, liver, spleen, adrenal glands, pancreas and biliary
tree appear normal. The patient has single punctate nonobstructing
stones in each kidney. There is no hydronephrosis on the right or
left and no ureteral stone is identified. The urinary bladder is
unremarkable. The patient is status post hysterectomy. A few sigmoid
diverticula are identified but there is no evidence of
diverticulitis. The colon is otherwise unremarkable. The stomach,
small bowel and appendix appear normal. Scattered aortoiliac
atherosclerosis without aneurysm is identified. There is no
lymphadenopathy or fluid. No focal bony abnormality is identified.
IMPRESSION: No acute finding abdomen or pelvis. No finding to explain the
patient's symptoms.

Single small nonobstructing stones are seen in each kidney.

Mild diverticulosis without diverticulitis.

## 2016-05-24 ENCOUNTER — Ambulatory Visit: Payer: Self-pay | Admitting: Gastroenterology

## 2016-07-25 ENCOUNTER — Encounter: Payer: Self-pay | Admitting: Gastroenterology

## 2016-07-25 ENCOUNTER — Ambulatory Visit (INDEPENDENT_AMBULATORY_CARE_PROVIDER_SITE_OTHER): Payer: Commercial Managed Care - HMO | Admitting: Gastroenterology

## 2016-07-25 VITALS — BP 144/78 | HR 92 | Ht 58.5 in | Wt 157.4 lb

## 2016-07-25 DIAGNOSIS — B029 Zoster without complications: Secondary | ICD-10-CM | POA: Diagnosis not present

## 2016-07-25 DIAGNOSIS — R1031 Right lower quadrant pain: Secondary | ICD-10-CM | POA: Diagnosis not present

## 2016-07-25 DIAGNOSIS — R933 Abnormal findings on diagnostic imaging of other parts of digestive tract: Secondary | ICD-10-CM

## 2016-07-25 MED ORDER — NA SULFATE-K SULFATE-MG SULF 17.5-3.13-1.6 GM/177ML PO SOLN: 1.0000 | Freq: Once | ORAL | 0 refills | Status: AC

## 2016-07-25 NOTE — Progress Notes (Signed)
HPI :  65 y/o female here for a follow up visit, previously followed by Dr. Olevia Perches, new to me. Last visit in 2015.  She reports a history of RLQ pain. She had this initially in 2010 which led to a colonoscopy at the time showing a large ulcer in the cecum of unclear etiology. Shortly afterwards she developed Shingles at the RLQ and thought to be the etiology for her pain.  She has had a few episodes of recurrence of shingles in the past.   She has had a shingles vaccine in the past. She had this pain in 2015 when she saw [name] Esterwood, painful rash c/w Shingles, and a CT at the time showed normal colon / small bowel. She was treated with anti-virals and got better. She had a recurrence of this again 2 months ago. The pain is usually only present if she has the rash, which is tender to the touch. She reported it was bothering her for a few weeks when she made this appointment, the pain has since resolved. The pain is dull, previously was tender to the touch. At times she has some pain in the periumbilical area. Eating does not make the pain worse. Having a BM does not change it at all.   She reports no trouble with her bowels. She has roughly one BM every few days. No blood in the stools. No nausea or vomiting. No weight loss, she thinks she has gained weight. No fevers or nightsweats. No FH of CRC. She reports having had similar pain in 2010 which led to her having a colonoscopy showing large cecal ulceration. She can't recall if she was taking NSAIDs at the time.   Prior workup: CT abdomen 07/31/2014 - punctate renal stones, sigmoid diverticulosis, normal colon otherwise, no pathology noted to account for symptoms  Colonoscopy 07/23/2009 - ulceration at cecum, IC valve ulcer, multiple sigmoid polyps removed - normal ileal biopsies, hyperplastic polyps, mild active inflammation at cecum     Past Medical History:  Diagnosis Date  . Anxiety   . Cecal ulcer   . Diabetes mellitus without  complication ([place])    diet controlled, pt does not check cbg at home  . Diverticulosis   . Endometriosis   . GERD (gastroesophageal reflux disease)   . Graves disease   . Herpes zoster   . History of kidney stones   . Hyperlipidemia   . Hyperplastic colon polyp   . Hypertension   . Hypothyroidism   . Kidney stones    right  . Migraines   . Osteopenia   . Pernicious anemia      Past Surgical History:  Procedure Laterality Date  . ABDOMINAL HYSTERECTOMY     with left oophorectomy  . CYSTOSCOPY WITH URETEROSCOPY Right 11/17/2013   Procedure: CYSTOSCOPY WITH URETEROSCOPY WITH RIGHT STENT PLACEMENT;  Surgeon: Claybon Jabs, MD;  Location: [facility];  Service: Urology;  Laterality: Right;  . EYE MUSCLE SURGERY Bilateral    multiple eye surgeries, eye lid surgery done also  . HOLMIUM LASER APPLICATION Right [id number]   Procedure: HOLMIUM LASER APPLICATION;  Surgeon: Claybon Jabs, MD;  Location: [facility];  Service: Urology;  Laterality: Right;  . index finger surgery Left 4 yrs ago  . LITHOTRIPSY    . ROTATOR CUFF REPAIR Left    Family History  Problem Relation Age of Onset  . Macular degeneration Mother   . Stroke Father   . Colon cancer Neg Hx  Social History  Substance Use Topics  . Smoking status: Current Every Day Smoker    Packs/day: 0.50    Years: 15.00  . Smokeless tobacco: Never Used  . Alcohol use Yes     Comment: very occasional wine   Current Outpatient Prescriptions  Medication Sig Dispense Refill  . acetaminophen (TYLENOL) 500 MG tablet Take 1,000 mg by mouth every 6 (six) hours as needed.    Marland Kitchen amLODipine (NORVASC) 5 MG tablet Take 5 mg by mouth every evening.    Marland Kitchen levothyroxine (SYNTHROID, LEVOTHROID) 125 MCG tablet Take 125 mcg by mouth daily before breakfast.    . losartan-hydrochlorothiazide (HYZAAR) 100-25 MG per tablet Take 1 tablet by mouth every evening.    . Nicotine (NICODERM CQ TD) Place onto the skin daily.     No current facility-administered  medications for this visit.    Allergies  Allergen Reactions  . Adhesive [Tape]   . Norvasc [Amlodipine Besylate]     Insomnia   . Toprol Xl [Metoprolol Tartrate]   . Ciprofloxacin Hcl Itching and Rash  . Contrast Media [Iodinated Diagnostic Agents] Rash  . Shrimp [Shellfish Allergy] Rash     Review of Systems: All systems reviewed and negative except where noted in HPI.   Lab Results  Component Value Date   WBC 9.7 11/14/2013   HGB 14.0 11/14/2013   HCT 42.4 11/14/2013   MCV 90.8 11/14/2013   PLT 399 11/14/2013    No results found for: ALT, AST, GGT, ALKPHOS, BILITOT  Lab Results  Component Value Date   CREATININE 0.46 (L) 11/14/2013   BUN 19 11/14/2013   NA 140 11/14/2013   K 3.7 11/14/2013   CL 97 11/14/2013   CO2 27 11/14/2013      Physical Exam: BP (!) 144/78 (BP Location: Left Arm, Patient Position: Sitting, Cuff Size: Normal)   Pulse 92   Ht 4' 10.5" (1.486 m) Comment: eight measured without shoes  Wt 157 lb 6 oz (71.4 kg)   BMI 32.33 kg/m  Constitutional: Pleasant,well-developed,female in no acute distress. HEENT: Normocephalic and atraumatic. Conjunctivae are normal. No scleral icterus. Neck supple.  Cardiovascular: Normal rate, regular rhythm.  Pulmonary/chest: Effort normal and breath sounds normal. No wheezing, rales or rhonchi. Abdominal: Soft, nondistended, nontender. No rash on abdomen appreciated.  There are no masses palpable. No hepatomegaly. Extremities: no edema Lymphadenopathy: No cervical adenopathy noted. Neurological: Alert and oriented to person place and time. Skin: Skin is warm and dry. No rashes noted. Psychiatric: Normal mood and affect. Behavior is normal.   ASSESSMENT AND PLAN: 65 year old female here for a follow-up visit for right lower quadrant pain. She has a history of what sounds like recurrent shingles in the right lower quadrant of her abdomen which I suspect is the cause of her pain. She reports 3 clear episodes of  painful rash with typical shingles description since 2010. She's had a single vaccine already. The present time her pain is resolved and she has no rash. If this recurs in the future I asked her to call us or her primary care for therapy.   In regards to her colonoscopy findings in the past, ddx includes NSAID related changes although at the time she denied this, or a prior self-limited infection. Crohn's disease seems very unlikely, as does ischemia. She denies any chronic bowel symptoms and I think this prior finding is unlikely related to her pain. We spent some time discussing this issue, and I reassured her, however she is rather  anxious about this finding. She is requesting a colonoscopy for peace of mind to ensure that there is no evidence of Crohn's disease or persitent abnormalities. I counseled her again that I think this prior finding is benign, and unrelated to her recent symptoms. After discussion of this issue she again voiced that she wished to have a colonoscopy.  I discussed the risks and benefits of colonoscopy and anesthesia, I offered her the exam for peace of mind, and she wanted to proceed.  [place] Cellar, MD [facility] Pager [phone]

## 2016-07-25 NOTE — Patient Instructions (Signed)
If you are age 65 or older, your body mass index should be between 23-30. Your Body mass index is 32.33 kg/m. If this is out of the aforementioned range listed, please consider follow up with your Primary Care Provider.  If you are age 23 or younger, your body mass index should be between 19-25. Your Body mass index is 32.33 kg/m. If this is out of the aformentioned range listed, please consider follow up with your Primary Care Provider.   We have sent the following medications to your pharmacy for you to pick up at your convenience: [place] have been scheduled for a colonoscopy. Please follow written instructions given to you at your visit today.  Please pick up your prep supplies at the pharmacy within the next 1-3 days. If you use inhalers (even only as needed), please bring them with you on the day of your procedure. Your physician has requested that you go to www.startemmi.com and enter the access code given to you at your visit today. This web site gives a general overview about your procedure. However, you should still follow specific instructions given to you by our office regarding your preparation for the procedure.

## 2016-08-16 ENCOUNTER — Encounter: Payer: Self-pay | Admitting: Gastroenterology

## 2016-08-24 ENCOUNTER — Encounter: Payer: Commercial Managed Care - HMO | Admitting: Gastroenterology

## 2019-07-17 ENCOUNTER — Encounter: Payer: Self-pay | Admitting: Gastroenterology

## 2020-08-11 ENCOUNTER — Encounter: Payer: Self-pay | Admitting: Neurology

## 2020-09-24 ENCOUNTER — Other Ambulatory Visit: Payer: Self-pay

## 2020-09-24 DIAGNOSIS — R202 Paresthesia of skin: Secondary | ICD-10-CM

## 2020-09-28 ENCOUNTER — Encounter: Payer: 59 | Admitting: Neurology

## 2020-10-20 ENCOUNTER — Encounter: Payer: 59 | Admitting: Neurology

## 2020-11-30 ENCOUNTER — Ambulatory Visit (INDEPENDENT_AMBULATORY_CARE_PROVIDER_SITE_OTHER): Payer: 59 | Admitting: Neurology

## 2020-11-30 ENCOUNTER — Other Ambulatory Visit: Payer: Self-pay

## 2020-11-30 DIAGNOSIS — R202 Paresthesia of skin: Secondary | ICD-10-CM

## 2020-11-30 DIAGNOSIS — G5603 Carpal tunnel syndrome, bilateral upper limbs: Secondary | ICD-10-CM

## 2020-11-30 NOTE — Procedures (Signed)
[facility]  [place], [place]  [place], [place] [address] Tel: [phone] Fax:  [phone] Test Date:  11/30/2020  Patient: Heather Bentley DOB: 1951-09-20 Physician: Narda Amber, DO  Sex: Female Height: 5' " Ref Phys: Daryll Brod, MD  ID#: [id number]   Technician:    Patient Complaints: This is a 70 year old female referred for evaluation of bilateral hand paresthesias, worse on the right.  NCV & EMG Findings: Extensive electrodiagnostic testing of the right upper extremity and additional studies of the left shows:  1. Right median sensory response shows latency (7.9 ms) and reduced amplitude (5.7 V). Left median sensory response shows prolonged latency (4.1 ms). Bilateral ulnar sensory responses are within normal limits. 2. Right median motor response shows severely prolonged latency (7.0 ms). Left median and bilateral ulnar motor responses are within normal limits.  3. Chronic motor axonal loss changes are isolated to the right abductor pollicis brevis muscle, without accompanying active denervation.   Impression: 1. 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   2. Left median neuropathy at or distal to the wrist (mild), consistent with a clinical diagnosis of carpal tunnel syndrome.     ___________________________ Narda Amber, DO    Nerve Conduction Studies Anti Sensory Summary Table   Stim Site NR Peak (ms) Norm Peak (ms) P-T Amp (V) Norm P-T Amp  Left Median Anti Sensory (2nd Digit)  33C  Wrist    4.1 <3.8 17.4 >10  Right Median Anti Sensory (2nd Digit)  33C  Wrist    7.9 <3.8 5.7 >10  Left Ulnar Anti Sensory (5th Digit)  33C  Wrist    2.6 <3.2 34.5 >5  Right Ulnar Anti Sensory (5th Digit)  33C  Wrist    2.6 <3.2 30.5 >5   Motor Summary Table   Stim Site NR Onset (ms) Norm Onset (ms) O-P Amp (mV) Norm O-P Amp Site1 Site2 Delta-0 (ms) Dist (cm) Vel (m/s) Norm  Vel (m/s)  Left Median Motor (Abd Poll Brev)  33C  Wrist    3.7 <4.0 9.0 >5 Elbow Wrist 4.7 28.0 60 >50  Elbow    8.4  8.5         Right Median Motor (Abd Poll Brev)  33C  Wrist    7.0 <4.0 6.2 >5 Elbow Wrist 4.6 25.0 54 >50  Elbow    11.6  5.9         Left Ulnar Motor (Abd Dig Minimi)  33C  Wrist    2.3 <3.1 9.9 >7 B Elbow Wrist 3.2 20.0 63 >50  B Elbow    5.5  9.0  A Elbow B Elbow 1.7 10.0 59 >50  A Elbow    7.2  9.0         Right Ulnar Motor (Abd Dig Minimi)  33C  Wrist    2.3 <3.1 7.9 >7 B Elbow Wrist 3.0 19.0 63 >50  B Elbow    5.3  7.0  A Elbow B Elbow 1.7 10.0 59 >50  A Elbow    7.0  7.0          EMG   Side Muscle Ins Act Fibs Psw Fasc Number Recrt Dur Dur. Amp Amp. Poly Poly. Comment  Right 1stDorInt Nml Nml Nml Nml Nml Nml Nml Nml Nml Nml Nml Nml N/A  Right Abd Poll Brev Nml Nml Nml Nml 2- Rapid Many 1+ Many 1+ Many 1+  N/A  Right PronatorTeres Nml Nml Nml Nml Nml Nml Nml Nml Nml Nml Nml Nml N/A  Right Biceps Nml Nml Nml Nml Nml Nml Nml Nml Nml Nml Nml Nml N/A  Right Triceps Nml Nml Nml Nml Nml Nml Nml Nml Nml Nml Nml Nml N/A  Right Deltoid Nml Nml Nml Nml Nml Nml Nml Nml Nml Nml Nml Nml N/A  Left PronatorTeres Nml Nml Nml Nml Nml Nml Nml Nml Nml Nml Nml Nml N/A  Left Abd Poll Brev Nml Nml Nml Nml Nml Nml Nml Nml Nml Nml Nml Nml N/A  Left 1stDorInt Nml Nml Nml Nml Nml Nml Nml Nml Nml Nml Nml Nml N/A  Left Biceps Nml Nml Nml Nml Nml Nml Nml Nml Nml Nml Nml Nml N/A  Left Deltoid Nml Nml Nml Nml Nml Nml Nml Nml Nml Nml Nml Nml N/A  Left Triceps Nml Nml Nml Nml Nml Nml Nml Nml Nml Nml Nml Nml N/A      Waveforms:

## 2020-12-20 ENCOUNTER — Other Ambulatory Visit: Payer: Self-pay | Admitting: Orthopedic Surgery

## 2021-01-18 ENCOUNTER — Other Ambulatory Visit: Payer: Self-pay

## 2021-01-18 ENCOUNTER — Encounter (HOSPITAL_BASED_OUTPATIENT_CLINIC_OR_DEPARTMENT_OTHER): Payer: Self-pay | Admitting: Orthopedic Surgery

## 2021-01-21 ENCOUNTER — Encounter (HOSPITAL_BASED_OUTPATIENT_CLINIC_OR_DEPARTMENT_OTHER)
Admission: RE | Admit: 2021-01-21 | Discharge: 2021-01-21 | Disposition: A | Discharge status: Home / Self Care | Payer: 59 | Source: Ambulatory Visit | Attending: Orthopedic Surgery | Admitting: Orthopedic Surgery

## 2021-01-21 ENCOUNTER — Other Ambulatory Visit (HOSPITAL_COMMUNITY)
Admission: RE | Admit: 2021-01-21 | Discharge: 2021-01-21 | Disposition: A | Discharge status: Home / Self Care | Payer: 59 | Source: Ambulatory Visit | Attending: Orthopedic Surgery | Admitting: Orthopedic Surgery

## 2021-01-21 DIAGNOSIS — Z20822 Contact with and (suspected) exposure to covid-19: Secondary | ICD-10-CM | POA: Insufficient documentation

## 2021-01-21 DIAGNOSIS — Z01812 Encounter for preprocedural laboratory examination: Secondary | ICD-10-CM | POA: Insufficient documentation

## 2021-01-21 LAB — BASIC METABOLIC PANEL
Anion gap: 7 (ref 5–15)
BUN: 14 mg/dL (ref 8–23)
CO2: 29 mmol/L (ref 22–32)
Calcium: 9.1 mg/dL (ref 8.9–10.3)
Chloride: 98 mmol/L (ref 98–111)
Creatinine, Ser: 0.53 mg/dL (ref 0.44–1.00)
GFR, Estimated: >60 mL/min (ref >60)
Glucose, Bld: 107 mg/dL — ABNORMAL HIGH (ref 70–99)
Potassium: 4 mmol/L (ref 3.5–5.1)
Sodium: 134 mmol/L — ABNORMAL LOW (ref 135–145)

## 2021-01-21 NOTE — Progress Notes (Signed)

## 2021-01-22 LAB — SARS CORONAVIRUS 2 (TAT 6-24 HRS): SARS Coronavirus 2: NEGATIVE

## 2021-01-25 ENCOUNTER — Encounter (HOSPITAL_BASED_OUTPATIENT_CLINIC_OR_DEPARTMENT_OTHER)
Admission: RE | Disposition: A | Discharge status: Home / Self Care | Payer: Self-pay | Source: Home / Self Care | Attending: Orthopedic Surgery

## 2021-01-25 ENCOUNTER — Ambulatory Visit (HOSPITAL_BASED_OUTPATIENT_CLINIC_OR_DEPARTMENT_OTHER): Payer: 59 | Admitting: Anesthesiology

## 2021-01-25 ENCOUNTER — Other Ambulatory Visit: Payer: Self-pay

## 2021-01-25 ENCOUNTER — Ambulatory Visit (HOSPITAL_BASED_OUTPATIENT_CLINIC_OR_DEPARTMENT_OTHER)
Admission: RE | Admit: 2021-01-25 | Discharge: 2021-01-25 | Disposition: A | Discharge status: Home / Self Care | Payer: 59 | Attending: Orthopedic Surgery | Admitting: Orthopedic Surgery

## 2021-01-25 ENCOUNTER — Encounter (HOSPITAL_BASED_OUTPATIENT_CLINIC_OR_DEPARTMENT_OTHER): Payer: Self-pay | Admitting: Orthopedic Surgery

## 2021-01-25 DIAGNOSIS — F17200 Nicotine dependence, unspecified, uncomplicated: Secondary | ICD-10-CM | POA: Insufficient documentation

## 2021-01-25 DIAGNOSIS — Z820 Family history of epilepsy and other diseases of the nervous system: Secondary | ICD-10-CM | POA: Insufficient documentation

## 2021-01-25 DIAGNOSIS — Z833 Family history of diabetes mellitus: Secondary | ICD-10-CM | POA: Insufficient documentation

## 2021-01-25 DIAGNOSIS — Z91041 Radiographic dye allergy status: Secondary | ICD-10-CM | POA: Diagnosis not present

## 2021-01-25 DIAGNOSIS — Z823 Family history of stroke: Secondary | ICD-10-CM | POA: Diagnosis not present

## 2021-01-25 DIAGNOSIS — G5603 Carpal tunnel syndrome, bilateral upper limbs: Secondary | ICD-10-CM | POA: Diagnosis not present

## 2021-01-25 DIAGNOSIS — E119 Type 2 diabetes mellitus without complications: Secondary | ICD-10-CM | POA: Diagnosis not present

## 2021-01-25 DIAGNOSIS — Z91013 Allergy to seafood: Secondary | ICD-10-CM | POA: Diagnosis not present

## 2021-01-25 DIAGNOSIS — I10 Essential (primary) hypertension: Secondary | ICD-10-CM | POA: Diagnosis not present

## 2021-01-25 DIAGNOSIS — K219 Gastro-esophageal reflux disease without esophagitis: Secondary | ICD-10-CM | POA: Diagnosis not present

## 2021-01-25 DIAGNOSIS — Z881 Allergy status to other antibiotic agents status: Secondary | ICD-10-CM | POA: Diagnosis not present

## 2021-01-25 DIAGNOSIS — Z888 Allergy status to other drugs, medicaments and biological substances status: Secondary | ICD-10-CM | POA: Diagnosis not present

## 2021-01-25 DIAGNOSIS — Z91048 Other nonmedicinal substance allergy status: Secondary | ICD-10-CM | POA: Insufficient documentation

## 2021-01-25 HISTORY — PX: CARPAL TUNNEL RELEASE: SHX101

## 2021-01-25 SURGERY — CARPAL TUNNEL RELEASE
Anesthesia: Monitor Anesthesia Care | Site: Wrist | Laterality: Right

## 2021-01-25 MED ORDER — MIDAZOLAM HCL 5 MG/5ML IJ SOLN
INTRAMUSCULAR | Status: DC | PRN
  Administered 2021-01-25: 2 mg via INTRAVENOUS

## 2021-01-25 MED ORDER — CEFAZOLIN SODIUM-DEXTROSE 2-4 GM/100ML-% IV SOLN
2.0000 g | INTRAVENOUS | Status: AC
  Administered 2021-01-25: 2 g via INTRAVENOUS

## 2021-01-25 MED ORDER — CEFAZOLIN SODIUM-DEXTROSE 2-4 GM/100ML-% IV SOLN
INTRAVENOUS | Status: AC
  Filled 2021-01-25: qty 100

## 2021-01-25 MED ORDER — ONDANSETRON HCL 4 MG/2ML IJ SOLN
INTRAMUSCULAR | Status: AC
  Filled 2021-01-25: qty 2

## 2021-01-25 MED ORDER — ONDANSETRON HCL 4 MG/2ML IJ SOLN: 4.0000 mg | Freq: Once | INTRAMUSCULAR | Status: DC | PRN

## 2021-01-25 MED ORDER — FENTANYL CITRATE (PF) 100 MCG/2ML IJ SOLN
INTRAMUSCULAR | Status: AC
  Filled 2021-01-25: qty 2

## 2021-01-25 MED ORDER — MIDAZOLAM HCL 2 MG/2ML IJ SOLN
INTRAMUSCULAR | Status: AC
  Filled 2021-01-25: qty 2

## 2021-01-25 MED ORDER — TRAMADOL HCL 50 MG PO TABS: 50.0000 mg | ORAL_TABLET | Freq: Four times a day (QID) | ORAL | 0 refills | Status: AC | PRN

## 2021-01-25 MED ORDER — AMISULPRIDE (ANTIEMETIC) 5 MG/2ML IV SOLN
10.0000 mg | Freq: Once | INTRAVENOUS | Status: DC | PRN
Start: 2021-01-25 — End: 2021-01-25

## 2021-01-25 MED ORDER — PROPOFOL 500 MG/50ML IV EMUL
INTRAVENOUS | Status: DC | PRN
  Administered 2021-01-25: 50 ug/kg/min via INTRAVENOUS

## 2021-01-25 MED ORDER — FENTANYL CITRATE (PF) 100 MCG/2ML IJ SOLN
INTRAMUSCULAR | Status: DC | PRN
  Administered 2021-01-25 (×2): 50 ug via INTRAVENOUS

## 2021-01-25 MED ORDER — FENTANYL CITRATE (PF) 100 MCG/2ML IJ SOLN: 25.0000 ug | INTRAMUSCULAR | Status: DC | PRN

## 2021-01-25 MED ORDER — LIDOCAINE HCL (PF) 0.5 % IJ SOLN
INTRAMUSCULAR | Status: DC | PRN
  Administered 2021-01-25: 30 mL via INTRAVENOUS

## 2021-01-25 MED ORDER — OXYCODONE HCL 5 MG PO TABS: 5.0000 mg | ORAL_TABLET | Freq: Once | ORAL | Status: DC | PRN

## 2021-01-25 MED ORDER — LACTATED RINGERS IV SOLN: INTRAVENOUS | Status: DC

## 2021-01-25 MED ORDER — BUPIVACAINE HCL (PF) 0.25 % IJ SOLN
INTRAMUSCULAR | Status: AC
  Filled 2021-01-25: qty 120

## 2021-01-25 MED ORDER — EPHEDRINE SULFATE 50 MG/ML IJ SOLN
INTRAMUSCULAR | Status: DC | PRN
  Administered 2021-01-25: 10 mg via INTRAVENOUS

## 2021-01-25 MED ORDER — BUPIVACAINE HCL (PF) 0.25 % IJ SOLN
INTRAMUSCULAR | Status: DC | PRN
  Administered 2021-01-25: 10 mL

## 2021-01-25 MED ORDER — OXYCODONE HCL 5 MG/5ML PO SOLN: 5.0000 mg | Freq: Once | ORAL | Status: DC | PRN

## 2021-01-25 SURGICAL SUPPLY — 34 items
APL PRP STRL LF DISP 70% ISPRP (MISCELLANEOUS) ×1
BLADE SURG 15 STRL LF DISP TIS (BLADE) ×1 IMPLANT
BLADE SURG 15 STRL SS (BLADE) ×2
BNDG CMPR 9X4 STRL LF SNTH (GAUZE/BANDAGES/DRESSINGS)
BNDG COHESIVE 3X5 TAN STRL LF (GAUZE/BANDAGES/DRESSINGS) ×2 IMPLANT
BNDG ESMARK 4X9 LF (GAUZE/BANDAGES/DRESSINGS) IMPLANT
BNDG GAUZE ELAST 4 BULKY (GAUZE/BANDAGES/DRESSINGS) ×2 IMPLANT
CHLORAPREP W/TINT 26 (MISCELLANEOUS) ×2 IMPLANT
CORD BIPOLAR FORCEPS 12FT (ELECTRODE) ×2 IMPLANT
COVER BACK TABLE 60X90IN (DRAPES) ×2 IMPLANT
COVER MAYO STAND STRL (DRAPES) ×2 IMPLANT
COVER WAND RF STERILE (DRAPES) IMPLANT
CUFF TOURN SGL QUICK 18X4 (TOURNIQUET CUFF) ×2 IMPLANT
DRAPE EXTREMITY T 121X128X90 (DISPOSABLE) ×2 IMPLANT
DRAPE SURG 17X23 STRL (DRAPES) ×2 IMPLANT
DRSG PAD ABDOMINAL 8X10 ST (GAUZE/BANDAGES/DRESSINGS) ×2 IMPLANT
GAUZE SPONGE 4X4 12PLY STRL (GAUZE/BANDAGES/DRESSINGS) ×2 IMPLANT
GAUZE XEROFORM 1X8 LF (GAUZE/BANDAGES/DRESSINGS) ×2 IMPLANT
GLOVE SURG ORTHO LTX SZ8 (GLOVE) ×2 IMPLANT
GLOVE SURG UNDER POLY LF SZ8.5 (GLOVE) ×2 IMPLANT
GOWN STRL REUS W/ TWL LRG LVL3 (GOWN DISPOSABLE) ×1 IMPLANT
GOWN STRL REUS W/TWL LRG LVL3 (GOWN DISPOSABLE) ×2
GOWN STRL REUS W/TWL XL LVL3 (GOWN DISPOSABLE) ×2 IMPLANT
NDL PRECISIONGLIDE 27X1.5 (NEEDLE) IMPLANT
NEEDLE PRECISIONGLIDE 27X1.5 (NEEDLE) IMPLANT
NS IRRIG 1000ML POUR BTL (IV SOLUTION) ×2 IMPLANT
PACK BASIN DAY SURGERY FS (CUSTOM PROCEDURE TRAY) ×2 IMPLANT
STOCKINETTE 4X48 STRL (DRAPES) ×2 IMPLANT
SUT ETHILON 4 0 PS 2 18 (SUTURE) ×2 IMPLANT
SUT VICRYL 4-0 PS2 18IN ABS (SUTURE) IMPLANT
SYR BULB EAR ULCER 3OZ GRN STR (SYRINGE) ×2 IMPLANT
SYR CONTROL 10ML LL (SYRINGE) IMPLANT
TOWEL GREEN STERILE FF (TOWEL DISPOSABLE) ×2 IMPLANT
UNDERPAD 30X36 HEAVY ABSORB (UNDERPADS AND DIAPERS) ×2 IMPLANT

## 2021-01-25 NOTE — Brief Op Note (Signed)
01/25/2021  9:24 AM  PATIENT:  Heather Bentley  70 y.o. female  PRE-OPERATIVE DIAGNOSIS:  RIGHT CARPAL TUNNEL SYNDROME  POST-OPERATIVE DIAGNOSIS:  RIGHT CARPAL TUNNEL SYNDROME  PROCEDURE:  Procedure(s): RIGHT CARPAL TUNNEL RELEASE (Right)  SURGEON:  Surgeon(s) and Role:    * Daryll Brod, MD - Primary  PHYSICIAN ASSISTANT:   ASSISTANTS: none   ANESTHESIA:   local, regional and IV sedation  EBL: 36ml BLOOD ADMINISTERED:none  DRAINS: none   LOCAL MEDICATIONS USED:  BUPIVICAINE   SPECIMEN:  No Specimen  DISPOSITION OF SPECIMEN:  N/A  COUNTS:  YES  TOURNIQUET:   Total Tourniquet Time Documented: Forearm (Right) - 23 minutes Total: Forearm (Right) - 23 minutes   DICTATION: .Viviann Spare Dictation  PLAN OF CARE: Discharge to home after PACU  PATIENT DISPOSITION:  PACU - hemodynamically stable.

## 2021-01-25 NOTE — Transfer of Care (Signed)
Immediate Anesthesia Transfer of Care Note  Patient: Sabra Heck  Procedure(s) Performed: RIGHT CARPAL TUNNEL RELEASE (Right Wrist)  Patient Location: PACU  Anesthesia Type:MAC and Bier block  Level of Consciousness: awake, alert  and oriented  Airway & Oxygen Therapy: Patient Spontanous Breathing and Patient connected to face mask oxygen  Post-op Assessment: Report given to RN and Post -op Vital signs reviewed and stable  Post vital signs: Reviewed and stable  Last Vitals:  Vitals Value Taken Time  BP 131/69 01/25/21 0921  Temp    Pulse 78 01/25/21 0923  Resp 13 01/25/21 0923  SpO2 99 % 01/25/21 0923  Vitals shown include unvalidated device data.  Last Pain:  Vitals:   01/25/21 0719  TempSrc: Oral  PainSc: 0-No pain         Complications: No complications documented.

## 2021-01-25 NOTE — Anesthesia Preprocedure Evaluation (Signed)
Anesthesia Evaluation  Patient identified by MRN, date of birth, ID band Patient awake    Reviewed: Allergy & Precautions, NPO status , Patient's Chart, lab work & pertinent test results  History of Anesthesia Complications Negative for: history of anesthetic complications  Airway Mallampati: II  TM Distance: >3 FB Neck ROM: Full    Dental  (+) Upper Dentures   Pulmonary Current Smoker,    Pulmonary exam normal        Cardiovascular hypertension, Pt. on medications Normal cardiovascular exam     Neuro/Psych  Headaches, Anxiety    GI/Hepatic Neg liver ROS, GERD  ,  Endo/Other  diabetes (pre-DM)Hypothyroidism (h/o Graves disease)   Renal/GU negative Renal ROS  negative genitourinary   Musculoskeletal negative musculoskeletal ROS (+)   Abdominal   Peds  Hematology negative hematology ROS (+)   Anesthesia Other Findings   Reproductive/Obstetrics                             Anesthesia Physical Anesthesia Plan  ASA: II  Anesthesia Plan: MAC and Bier Block and Bier Block-LIDOCAINE ONLY   Post-op Pain Management:    Induction: Intravenous  PONV Risk Score and Plan: 2 and Propofol infusion, TIVA and Treatment may vary due to age or medical condition  Airway Management Planned: Natural Airway, Nasal Cannula and Simple Face Mask  Additional Equipment: None  Intra-op Plan:   Post-operative Plan:   Informed Consent: I have reviewed the patients History and Physical, chart, labs and discussed the procedure including the risks, benefits and alternatives for the proposed anesthesia with the patient or authorized representative who has indicated his/her understanding and acceptance.       Plan Discussed with:   Anesthesia Plan Comments:         Anesthesia Quick Evaluation

## 2021-01-25 NOTE — Discharge Instructions (Addendum)

## 2021-01-25 NOTE — Anesthesia Procedure Notes (Addendum)
Anesthesia Regional Block: Bier block (IV Regional)   Pre-Anesthetic Checklist: ,, timeout performed, Correct Patient, Correct Site, Correct Laterality, Correct Procedure,, site marked, surgical consent,, at surgeon's request  Laterality: Right     Needles:  Injection technique: Single-shot  Needle Type: Other      Needle Gauge: 22     Additional Needles:   Procedures:,,,,, intact distal pulses, Esmarch exsanguination, single tourniquet utilized,  Narrative:  Start time: 01/25/2021 8:52 AM End time: 01/25/2021 8:53 AM  Performed by: Personally

## 2021-01-25 NOTE — Anesthesia Postprocedure Evaluation (Signed)
Anesthesia Post Note  Patient: Heather Bentley  Procedure(s) Performed: RIGHT CARPAL TUNNEL RELEASE (Right Wrist)     Patient location during evaluation: PACU Anesthesia Type: MAC Level of consciousness: awake and alert Pain management: pain level controlled Vital Signs Assessment: post-procedure vital signs reviewed and stable Respiratory status: spontaneous breathing, nonlabored ventilation and respiratory function stable Cardiovascular status: blood pressure returned to baseline and stable Postop Assessment: no apparent nausea or vomiting Anesthetic complications: no   No complications documented.  Last Vitals:  Vitals:   01/25/21 0941 01/25/21 1011  BP: 117/66 134/61  Pulse: 78 85  Resp: 14 18  Temp:  36.7 C  SpO2: 95% 100%    Last Pain:  Vitals:   01/25/21 0955  TempSrc:   PainSc: 0-No pain                 Lidia Collum

## 2021-01-25 NOTE — Op Note (Signed)
NAME: Heather Bentley RECORD NO: [id number] DATE OF BIRTH: January 20, 1951 FACILITY: [facility] LOCATION: [facility] PHYSICIAN: Wynonia Sours, MD   OPERATIVE REPORT   DATE OF PROCEDURE: 01/25/21    PREOPERATIVE DIAGNOSIS:   Tunnel syndrome right hand   POSTOPERATIVE DIAGNOSIS:   Same   PROCEDURE:   Decompression median nerve right hand   SURGEON: Daryll Brod, M.D.   ASSISTANT: none   ANESTHESIA:  Bier block with sedation and Local   INTRAVENOUS FLUIDS:  Per anesthesia flow sheet.   ESTIMATED BLOOD LOSS:  Minimal.   COMPLICATIONS:  None.   SPECIMENS:  none   TOURNIQUET TIME:    Total Tourniquet Time Documented: Forearm (Right) - 23 minutes Total: Forearm (Right) - 23 minutes    DISPOSITION:  Stable to PACU.   INDICATIONS: Patient is a 70 year old female with history of numbness and tingling bilateral hands not responsive to conservative treatment with positive nerve conductions.  She has elected to undergo scheduled for surgical decompression of the median nerve.  This to her right hand.  Preperi-and postoperative course been discussed along with risk complications.  She is aware that there is no guarantee to the surgery the possibility of infection recurrence injury to arteries nerves tendons complete relief symptoms and dystrophy.  In the preoperative area the patient is seen the extremity marked by both patient and surgeon antibiotic     OPERATIVE COURSE:   the patient is seen the extremity marked by both patient and surgeon antibiotic patient is brought to the operating room where form based IV regional anesthetic was carried out without difficulty under the direction the anesthesia department.  She was prepped using ChloraPrep in the supine position with the right arm free.  A 3-minute dry time was allowed and a timeout taken to confirm patient procedure.  A longitudinal incision was made in the right palm carried down through subcutaneous tissue.   Bleeders were electrocauterized with bipolar.  The palmar fascia was split.  Superficial palmar arch was identified identified distally along with the ulnar nerve.  Flexor tendon to the ring little finger were identified.  Retractors were placed retracting median nerve radially ulnar nerve ulnarly.  The flexor retinaculum was then released on its ulnar border.  The right ankle sect See retractor was then placed along with a stool retractor allowing visualization of the distal forearm fascia.  The deep structures were dissected free with blunt dissection.  Blunt considers were then used to release the proximal aspect of the flexor retinaculum distal forearm fascia for approximately 2 cm proximal to the wrist crease under direct vision.  The canal was explored.  Area compression to the nerve was apparent.  Motor branch entered in the muscle distally.  The wound was copiously irrigated with saline.  The skin was closed interrupted 4-0 nylon sutures.  Local infiltration quarter percent bupivacaine without epinephrine was given approximately 9 cc was used.  Sterile compressive dressing was applied with the fingers free.  Inflation of the tourniquet all fingers immediately pink.  She was taken to the recovery room for observation in satisfactory condition.  She will be discharged home to return to the hand center [facility] in 1 week Tylenol ibuprofen for pain with Ultram for breakthrough.   Daryll Brod, MD Electronically signed, 01/25/21

## 2021-01-25 NOTE — H&P (Signed)
Heather Bentley is an 70 y.o. female.   Chief Complaint:numbness right hand HPI:Heather Bentley is a 70 year old female comes in with a complaint of numbness in her right hand and mass on the volar radial aspect of her right wrist. She is right-handed. She is referred by Heather Bentley. She is complaining of numbness and tingling in her right thumb and middle finger has been going on for a year increase from past 3 to 4 weeks. She is also complaining of a volar mass on the right wrist. She states that this was injected and aspirated with the hopes that it would clot off. She states it is not entirely disappeared. This was in June. She is awake and 7 out of 7 nights with no history of injury to the hand or to the neck. He has not not been taking anything for this. He has no complaints of her left side. She has a history of thyroid problems no history of diabetes arthritis or gout. Family history is positive for diabetes. She has been tested and been told she is borderline. She was referred for an ultrasound which reveals no ganglion cyst. She has had nerve conductions done by Heather Bentley revealing bilateral carpal tunnel syndrome with a motor delay of 7.0 on the right side and a sensory response of 7.9 she shows normal motor response on the left with a prolonged latency of 4.1 sensory.    Past Medical History:  Diagnosis Date  . Anxiety   . Cecal ulcer   . Diabetes mellitus without complication ([place])    diet controlled, pt does not check cbg at home  . Diverticulosis   . Endometriosis   . GERD (gastroesophageal reflux disease)   . Graves disease   . Herpes zoster   . History of kidney stones   . Hyperlipidemia   . Hyperplastic colon polyp   . Hypertension   . Hypothyroidism   . Kidney stones    right  . Migraines   . Osteopenia   . Pernicious anemia     Past Surgical History:  Procedure Laterality Date  . ABDOMINAL HYSTERECTOMY     with left oophorectomy  . CYSTOSCOPY WITH URETEROSCOPY  Right 11/17/2013   Procedure: CYSTOSCOPY WITH URETEROSCOPY WITH RIGHT STENT PLACEMENT;  Surgeon: Claybon Jabs, MD;  Location: [facility];  Service: Urology;  Laterality: Right;  . EYE MUSCLE SURGERY Bilateral    multiple eye surgeries, eye lid surgery done also  . HOLMIUM LASER APPLICATION Right 01/20/6545   Procedure: HOLMIUM LASER APPLICATION;  Surgeon: Claybon Jabs, MD;  Location: [facility];  Service: Urology;  Laterality: Right;  . index finger surgery Left 4 yrs ago  . LITHOTRIPSY    . ROTATOR CUFF REPAIR Left     Family History  Problem Relation Age of Onset  . Macular degeneration Mother   . Stroke Father   . Colon cancer Neg Hx    Social History:  reports that she has been smoking. She has a 7.50 pack-year smoking history. She has never used smokeless tobacco. She reports current alcohol use. She reports that she does not use drugs.  Allergies:  Allergies  Allergen Reactions  . Adhesive [Tape]   . Norvasc [Amlodipine Besylate]     Insomnia   . Toprol Xl [Metoprolol Tartrate]   . Ciprofloxacin Hcl Itching and Rash  . Contrast Media [Iodinated Diagnostic Agents] Rash  . Shrimp [Shellfish Allergy] Rash    No medications prior to admission.  No results found for this or any previous visit (from the past 48 hour(s)).  No results found.   Pertinent items are noted in HPI.  Height 5' (1.524 m), weight 61.2 kg.  General appearance: alert Head: Normocephalic, without obvious abnormality Neck: no JVD Resp: clear to auscultation bilaterally Cardio: regular rate and rhythm, S1, S2 normal, no murmur, click, rub or gallop GI: soft, non-tender; bowel sounds normal; no masses,  no organomegaly Extremities: Complaint:numbness right hand Pulses: 2+ and symmetric Skin: Skin color, texture, turgor normal. No rashes or lesions Neurologic: Grossly normal Incision/Wound: na  Assessment/Plan Diagnosis bilateral carpal tunnel syndrome  Plan: We have discussed her ultrasound and  her nerve conductions with her. Would recommend decompression of the median nerve right side which she would like to have done. Preperi-and postoperative course been discussed along with risks and complications. She is aware there is no guarantee to the surgery the possibility of infection recurrence injury to arteries nerves tendons incomplete relief symptoms dystrophy. She is scheduled for a right carpal tunnel release in outpatient under regional anesthesia.      Heather Bentley 01/25/2021, 4:22 AM

## 2021-01-26 ENCOUNTER — Encounter (HOSPITAL_BASED_OUTPATIENT_CLINIC_OR_DEPARTMENT_OTHER): Payer: Self-pay | Admitting: Orthopedic Surgery

## 2021-01-26 NOTE — Addendum Note (Signed)
Addendum  created 01/26/21 0759 by Maryella Shivers, CRNA   Charge Capture section accepted

## 2022-06-02 DIAGNOSIS — I10 Essential (primary) hypertension: Secondary | ICD-10-CM | POA: Diagnosis not present

## 2022-06-02 DIAGNOSIS — M25562 Pain in left knee: Secondary | ICD-10-CM | POA: Diagnosis not present

## 2022-07-17 DIAGNOSIS — R109 Unspecified abdominal pain: Secondary | ICD-10-CM | POA: Diagnosis not present

## 2022-07-17 DIAGNOSIS — E538 Deficiency of other specified B group vitamins: Secondary | ICD-10-CM | POA: Diagnosis not present

## 2022-07-31 DIAGNOSIS — E7800 Pure hypercholesterolemia, unspecified: Secondary | ICD-10-CM | POA: Diagnosis not present

## 2022-07-31 DIAGNOSIS — F419 Anxiety disorder, unspecified: Secondary | ICD-10-CM | POA: Diagnosis not present

## 2022-07-31 DIAGNOSIS — D510 Vitamin B12 deficiency anemia due to intrinsic factor deficiency: Secondary | ICD-10-CM | POA: Diagnosis not present

## 2022-07-31 DIAGNOSIS — R7303 Prediabetes: Secondary | ICD-10-CM | POA: Diagnosis not present

## 2022-07-31 DIAGNOSIS — Z79899 Other long term (current) drug therapy: Secondary | ICD-10-CM | POA: Diagnosis not present

## 2022-07-31 DIAGNOSIS — I10 Essential (primary) hypertension: Secondary | ICD-10-CM | POA: Diagnosis not present

## 2022-07-31 DIAGNOSIS — Z23 Encounter for immunization: Secondary | ICD-10-CM | POA: Diagnosis not present

## 2022-07-31 DIAGNOSIS — Z1331 Encounter for screening for depression: Secondary | ICD-10-CM | POA: Diagnosis not present

## 2022-07-31 DIAGNOSIS — E890 Postprocedural hypothyroidism: Secondary | ICD-10-CM | POA: Diagnosis not present

## 2022-09-14 DIAGNOSIS — E890 Postprocedural hypothyroidism: Secondary | ICD-10-CM | POA: Diagnosis not present

## 2022-09-14 DIAGNOSIS — D510 Vitamin B12 deficiency anemia due to intrinsic factor deficiency: Secondary | ICD-10-CM | POA: Diagnosis not present

## 2022-10-19 DIAGNOSIS — I10 Essential (primary) hypertension: Secondary | ICD-10-CM | POA: Diagnosis not present

## 2022-10-19 DIAGNOSIS — J0100 Acute maxillary sinusitis, unspecified: Secondary | ICD-10-CM | POA: Diagnosis not present

## 2022-10-19 DIAGNOSIS — Z20822 Contact with and (suspected) exposure to covid-19: Secondary | ICD-10-CM | POA: Diagnosis not present

## 2022-10-26 DIAGNOSIS — E538 Deficiency of other specified B group vitamins: Secondary | ICD-10-CM | POA: Diagnosis not present

## 2022-11-27 DIAGNOSIS — E538 Deficiency of other specified B group vitamins: Secondary | ICD-10-CM | POA: Diagnosis not present

## 2023-01-26 DIAGNOSIS — E538 Deficiency of other specified B group vitamins: Secondary | ICD-10-CM | POA: Diagnosis not present

## 2023-02-05 DIAGNOSIS — M130 Polyarthritis, unspecified: Secondary | ICD-10-CM | POA: Diagnosis not present

## 2023-02-05 DIAGNOSIS — I10 Essential (primary) hypertension: Secondary | ICD-10-CM | POA: Diagnosis not present

## 2023-02-05 DIAGNOSIS — E890 Postprocedural hypothyroidism: Secondary | ICD-10-CM | POA: Diagnosis not present

## 2023-02-05 DIAGNOSIS — G4700 Insomnia, unspecified: Secondary | ICD-10-CM | POA: Diagnosis not present

## 2023-02-05 DIAGNOSIS — D510 Vitamin B12 deficiency anemia due to intrinsic factor deficiency: Secondary | ICD-10-CM | POA: Diagnosis not present

## 2023-02-05 DIAGNOSIS — E7800 Pure hypercholesterolemia, unspecified: Secondary | ICD-10-CM | POA: Diagnosis not present

## 2023-02-05 DIAGNOSIS — Z79899 Other long term (current) drug therapy: Secondary | ICD-10-CM | POA: Diagnosis not present

## 2023-02-05 DIAGNOSIS — F419 Anxiety disorder, unspecified: Secondary | ICD-10-CM | POA: Diagnosis not present

## 2023-02-05 DIAGNOSIS — R7303 Prediabetes: Secondary | ICD-10-CM | POA: Diagnosis not present

## 2023-02-12 ENCOUNTER — Encounter: Payer: Self-pay | Admitting: Gastroenterology

## 2023-02-12 DIAGNOSIS — Z1231 Encounter for screening mammogram for malignant neoplasm of breast: Secondary | ICD-10-CM | POA: Diagnosis not present

## 2023-02-12 DIAGNOSIS — E2839 Other primary ovarian failure: Secondary | ICD-10-CM | POA: Diagnosis not present

## 2023-02-12 DIAGNOSIS — Z13820 Encounter for screening for osteoporosis: Secondary | ICD-10-CM | POA: Diagnosis not present

## 2023-02-12 DIAGNOSIS — M8588 Other specified disorders of bone density and structure, other site: Secondary | ICD-10-CM | POA: Diagnosis not present

## 2023-02-13 DIAGNOSIS — J438 Other emphysema: Secondary | ICD-10-CM | POA: Diagnosis not present

## 2023-02-13 DIAGNOSIS — J432 Centrilobular emphysema: Secondary | ICD-10-CM | POA: Diagnosis not present

## 2023-02-13 DIAGNOSIS — F17210 Nicotine dependence, cigarettes, uncomplicated: Secondary | ICD-10-CM | POA: Diagnosis not present

## 2023-02-13 DIAGNOSIS — I2510 Atherosclerotic heart disease of native coronary artery without angina pectoris: Secondary | ICD-10-CM | POA: Diagnosis not present

## 2023-02-13 DIAGNOSIS — Z122 Encounter for screening for malignant neoplasm of respiratory organs: Secondary | ICD-10-CM | POA: Diagnosis not present

## 2023-02-28 ENCOUNTER — Telehealth: Payer: Self-pay

## 2023-02-28 ENCOUNTER — Ambulatory Visit (AMBULATORY_SURGERY_CENTER): Payer: Medicare HMO

## 2023-02-28 ENCOUNTER — Other Ambulatory Visit: Payer: Self-pay | Admitting: Gastroenterology

## 2023-02-28 VITALS — Ht 60.0 in | Wt 136.0 lb

## 2023-02-28 DIAGNOSIS — Z1211 Encounter for screening for malignant neoplasm of colon: Secondary | ICD-10-CM

## 2023-02-28 MED ORDER — ONDANSETRON HCL 4 MG PO TABS: 4.0000 mg | ORAL_TABLET | ORAL | 0 refills | Status: AC

## 2023-02-28 MED ORDER — SUTAB 1479-225-188 MG PO TABS: 12.0000 | ORAL_TABLET | ORAL | 0 refills | Status: DC

## 2023-02-28 NOTE — Telephone Encounter (Signed)
Printed new prep instructions and sent in mail.

## 2023-02-28 NOTE — Telephone Encounter (Signed)
Patient wants to change Sutab to Suprep.  Sent new prescription to pharmacy and sending new instructions.

## 2023-02-28 NOTE — Progress Notes (Signed)

## 2023-03-08 DIAGNOSIS — E538 Deficiency of other specified B group vitamins: Secondary | ICD-10-CM | POA: Diagnosis not present

## 2023-03-20 ENCOUNTER — Encounter: Payer: Self-pay | Admitting: Gastroenterology

## 2023-03-30 ENCOUNTER — Encounter: Payer: 59 | Admitting: Gastroenterology

## 2023-04-12 ENCOUNTER — Ambulatory Visit: Payer: Medicare HMO | Admitting: Gastroenterology

## 2023-04-12 ENCOUNTER — Encounter: Payer: Self-pay | Admitting: Gastroenterology

## 2023-04-12 VITALS — BP 123/58 | HR 75 | Temp 98.6°F | Resp 19 | Ht 60.0 in | Wt 136.0 lb

## 2023-04-12 DIAGNOSIS — D122 Benign neoplasm of ascending colon: Secondary | ICD-10-CM | POA: Diagnosis not present

## 2023-04-12 DIAGNOSIS — D125 Benign neoplasm of sigmoid colon: Secondary | ICD-10-CM | POA: Diagnosis not present

## 2023-04-12 DIAGNOSIS — Z1211 Encounter for screening for malignant neoplasm of colon: Secondary | ICD-10-CM | POA: Diagnosis not present

## 2023-04-12 DIAGNOSIS — D123 Benign neoplasm of transverse colon: Secondary | ICD-10-CM

## 2023-04-12 DIAGNOSIS — E119 Type 2 diabetes mellitus without complications: Secondary | ICD-10-CM | POA: Diagnosis not present

## 2023-04-12 DIAGNOSIS — I10 Essential (primary) hypertension: Secondary | ICD-10-CM | POA: Diagnosis not present

## 2023-04-12 DIAGNOSIS — K635 Polyp of colon: Secondary | ICD-10-CM | POA: Diagnosis not present

## 2023-04-12 DIAGNOSIS — F419 Anxiety disorder, unspecified: Secondary | ICD-10-CM | POA: Diagnosis not present

## 2023-04-12 DIAGNOSIS — E039 Hypothyroidism, unspecified: Secondary | ICD-10-CM | POA: Diagnosis not present

## 2023-04-12 MED ORDER — SODIUM CHLORIDE 0.9 % IV SOLN: 500.0000 mL | Freq: Once | INTRAVENOUS | Status: DC

## 2023-04-12 NOTE — Progress Notes (Signed)
[facility] History and Physical   Primary Care Physician:  Ailene Ravel, MD   Reason for Procedure:   Colon cancer screening  Plan:    colonoscopy     HPI: Heather Bentley is a 72 y.o. female  here for colonoscopy screening. LAst exam 2010.  Patient denies any bowel symptoms at this time. No family history of colon cancer known. Otherwise feels well without any cardiopulmonary symptoms.   I have discussed risks / benefits of anesthesia and endoscopic procedure with Heather Bentley and they wish to proceed with the exams as outlined today.    Past Medical History:  Diagnosis Date   Anxiety    Cecal ulcer    Diabetes mellitus without complication ([facility])    diet controlled, pt does not check cbg at home   Diverticulosis    Endometriosis    GERD (gastroesophageal reflux disease)    Graves disease    Herpes zoster    History of kidney stones    Hyperlipidemia    Hyperplastic colon polyp    Hypertension    Hypothyroidism    Kidney stones    right   Migraines    Osteopenia    Pernicious anemia     Past Surgical History:  Procedure Laterality Date   ABDOMINAL HYSTERECTOMY     with left oophorectomy   CARPAL TUNNEL RELEASE Right 01/25/2021   Procedure: RIGHT CARPAL TUNNEL RELEASE;  Surgeon: Cindee Salt, MD;  Location: [facility];  Service: Orthopedics;  Laterality: Right;   CYSTOSCOPY WITH URETEROSCOPY Right 11/17/2013   Procedure: CYSTOSCOPY WITH URETEROSCOPY WITH RIGHT STENT PLACEMENT;  Surgeon: Garnett Farm, MD;  Location: [facility];  Service: Urology;  Laterality: Right;   EYE MUSCLE SURGERY Bilateral    multiple eye surgeries, eye lid surgery done also   HOLMIUM LASER APPLICATION Right 11/17/2013   Procedure: HOLMIUM LASER APPLICATION;  Surgeon: Garnett Farm, MD;  Location: [facility];  Service: Urology;  Laterality: Right;   index finger surgery Left 4 yrs ago   LITHOTRIPSY     ROTATOR CUFF REPAIR Left     Prior to Admission medications    Medication Sig Start Date End Date Taking? Authorizing Provider  ALPRAZolam Prudy Feeler) 0.5 MG tablet Take 0.5 mg by mouth at bedtime.   Yes [provider]  amLODipine (NORVASC) 5 MG tablet Take 5 mg by mouth every evening.   Yes [provider]  ezetimibe (ZETIA) 10 MG tablet Take by mouth. 01/18/21  Yes [provider]  levothyroxine (SYNTHROID) 112 MCG tablet  12/25/16  Yes [provider]  losartan-hydrochlorothiazide (HYZAAR) 100-25 MG per tablet Take 1 tablet by mouth every evening.   Yes [provider]  acetaminophen (TYLENOL) 500 MG tablet Take 1,000 mg by mouth every 6 (six) hours as needed.    [provider]  ibuprofen (ADVIL) 200 MG tablet Take 400 mg by mouth every 6 (six) hours as needed.    [provider]  Nicotine (NICODERM CQ TD) Place onto the skin daily. Patient not taking: Reported on 04/12/2023    [provider]  ondansetron (ZOFRAN) 4 MG tablet Take 1 tablet (4 mg total) by mouth as directed. Take one Zofran 4 mg tablet 30-60 minutes before each colonoscopy prep dose 02/28/23   Kelseigh Diver, Willaim Rayas, MD  traMADol (ULTRAM) 50 MG tablet Take 1 tablet (50 mg total) by mouth every 6 (six) hours as needed. 01/25/21   Cindee Salt, MD  Current Outpatient Medications  Medication Sig Dispense Refill   ALPRAZolam (XANAX) 0.5 MG tablet Take 0.5 mg by mouth at bedtime.     amLODipine (NORVASC) 5 MG tablet Take 5 mg by mouth every evening.     ezetimibe (ZETIA) 10 MG tablet Take by mouth.     levothyroxine (SYNTHROID) 112 MCG tablet      losartan-hydrochlorothiazide (HYZAAR) 100-25 MG per tablet Take 1 tablet by mouth every evening.     acetaminophen (TYLENOL) 500 MG tablet Take 1,000 mg by mouth every 6 (six) hours as needed.     ibuprofen (ADVIL) 200 MG tablet Take 400 mg by mouth every 6 (six) hours as needed.     Nicotine (NICODERM CQ TD) Place onto the skin daily. (Patient not taking: Reported on 04/12/2023)      ondansetron (ZOFRAN) 4 MG tablet Take 1 tablet (4 mg total) by mouth as directed. Take one Zofran 4 mg tablet 30-60 minutes before each colonoscopy prep dose 2 tablet 0   traMADol (ULTRAM) 50 MG tablet Take 1 tablet (50 mg total) by mouth every 6 (six) hours as needed. 20 tablet 0   Current Facility-Administered Medications  Medication Dose Route Frequency Provider Last Rate Last Admin   0.9 %  sodium chloride infusion  500 mL Intravenous Once Skarlett Sedlacek, Willaim Rayas, MD        Allergies as of 04/12/2023 - Review Complete 04/12/2023  Allergen Reaction Noted   Toprol xl [metoprolol tartrate]  08/03/2014   Adhesive [tape] Rash 08/03/2014   Ciprofloxacin hcl Itching and Rash 11/13/2013   Contrast media [iodinated contrast media] Rash 11/14/2013   Shrimp [shellfish allergy] Rash 11/13/2013    Family History  Problem Relation Age of Onset   Macular degeneration Mother    Stroke Father    Colon cancer Neg Hx    Colon polyps Neg Hx    Esophageal cancer Neg Hx    Rectal cancer Neg Hx    Stomach cancer Neg Hx     Social History   Socioeconomic History   Marital status: Widowed    Spouse name: Not on file   Number of children: Not on file   Years of education: Not on file   Highest education level: Not on file  Occupational History   Not on file  Tobacco Use   Smoking status: Every Day    Packs/day: 0.50    Years: 15.00    Additional pack years: 0.00    Total pack years: 7.50    Types: Cigarettes   Smokeless tobacco: Never  Substance and Sexual Activity   Alcohol use: Yes    Comment: very occasional wine   Drug use: No   Sexual activity: Not Currently    Birth control/protection: Surgical  Other Topics Concern   Not on file  Social History Narrative   Not on file   Social Determinants of Health   Financial Resource Strain: Not on file  Food Insecurity: Not on file  Transportation Needs: Not on file  Physical Activity: Not on file  Stress: Not on file  Social  Connections: Not on file  Intimate Partner Violence: Not on file    Review of Systems: All other review of systems negative except as mentioned in the HPI.  Physical Exam: Vital signs BP (!) 152/75   Pulse 83   Temp 98.6 F (37 C)   Resp 16   Ht 5' (1.524 m)   Wt 136 lb (61.7 kg)   SpO2 99%  BMI 26.56 kg/m   General:   Alert,  Well-developed, pleasant and cooperative in NAD Lungs:  Clear throughout to auscultation.   Heart:  Regular rate and rhythm Abdomen:  Soft, nontender and nondistended.   Neuro/Psych:  Alert and cooperative. Normal mood and affect. A and O x 3  Harlin Rain, MD [facility]

## 2023-04-12 NOTE — Patient Instructions (Addendum)
Recommendation: - Patient has a contact number available for                            emergencies. The signs and symptoms of potential                            delayed complications were discussed with the                            patient. Return to normal activities tomorrow.                            Written discharge instructions were provided to the                            patient.                           - Resume previous diet.                           - Continue present medications.                           - Await pathology results.  Handouts on polyps, diverticulosis and hemorrhoids given.  YOU HAD AN ENDOSCOPIC PROCEDURE TODAY AT THE [facility]:   Refer to the procedure report that was given to you for any specific questions about what was found during the examination.  If the procedure report does not answer your questions, please call your gastroenterologist to clarify.  If you requested that your care partner not be given the details of your procedure findings, then the procedure report has been included in a sealed envelope for you to review at your convenience later.  YOU SHOULD EXPECT: Some feelings of bloating in the abdomen. Passage of more gas than usual.  Walking can help get rid of the air that was put into your GI tract during the procedure and reduce the bloating. If you had a lower endoscopy (such as a colonoscopy or flexible sigmoidoscopy) you may notice spotting of blood in your stool or on the toilet paper. If you underwent a bowel prep for your procedure, you may not have a normal bowel movement for a few days.  Please Note:  You might notice some irritation and congestion in your nose or some drainage.  This is from the oxygen used during your procedure.  There is no need for concern and it should clear up in a day or so.  SYMPTOMS TO REPORT IMMEDIATELY:  Following lower endoscopy (colonoscopy or flexible sigmoidoscopy):  Excessive amounts  of blood in the stool  Significant tenderness or worsening of abdominal pains  Swelling of the abdomen that is new, acute  Fever of 100F or higher  For urgent or emergent issues, a gastroenterologist can be reached at any hour by calling [phone]. Do not use MyChart messaging for urgent concerns.    DIET:  We do recommend a small meal at first, but then you may proceed to your regular diet.  Drink plenty of fluids but you should avoid alcoholic beverages for 24 hours.  ACTIVITY:  You should   plan to take it easy for the rest of today and you should NOT DRIVE or use heavy machinery until tomorrow (because of the sedation medicines used during the test).    FOLLOW UP: Our staff will call the number listed on your records the next business day following your procedure.  We will call around 7:15- 8:00 am to check on you and address any questions or concerns that you may have regarding the information given to you following your procedure. If we do not reach you, we will leave a message.     If any biopsies were taken you will be contacted by phone or by letter within the next 1-3 weeks.  Please call us at [phone] if you have not heard about the biopsies in 3 weeks.    SIGNATURES/CONFIDENTIALITY: You and/or your care partner have signed paperwork which will be entered into your electronic medical record.  These signatures attest to the fact that that the information above on your After Visit Summary has been reviewed and is understood.  Full responsibility of the confidentiality of this discharge information lies with you and/or your care-partner. 

## 2023-04-12 NOTE — Op Note (Signed)
[facility] Patient Name: Heather Bentley Procedure Date: 04/12/2023 2:30 PM MRN: [id number] Endoscopist: Viviann Spare P. Adela Lank , MD, [id number] Age: 72 Referring MD:  Date of Birth: 03/29/1951 Gender: Female Account #: [id number] Procedure:                Colonoscopy Indications:              Screening for colorectal malignant neoplasm Medicines:                Monitored Anesthesia Care Procedure:                Pre-Anesthesia Assessment:                           - Prior to the procedure, a History and Physical                            was performed, and patient medications and                            allergies were reviewed. The patient's tolerance of                            previous anesthesia was also reviewed. The risks                            and benefits of the procedure and the sedation                            options and risks were discussed with the patient.                            All questions were answered, and informed consent                            was obtained. Prior Anticoagulants: The patient has                            taken no anticoagulant or antiplatelet agents. ASA                            Grade Assessment: III - A patient with severe                            systemic disease. After reviewing the risks and                            benefits, the patient was deemed in satisfactory                            condition to undergo the procedure.                           After obtaining informed consent, the colonoscope  was passed under direct vision. Throughout the                            procedure, the patient's blood pressure, pulse, and                            oxygen saturations were monitored continuously. The                            Olympus CF-[id number] SN [id number] was introduced through                            the anus and advanced to the the cecum, identified                            by  appendiceal orifice and ileocecal valve. The                            colonoscopy was performed without difficulty. The                            patient tolerated the procedure well. The quality                            of the bowel preparation was good. The ileocecal                            valve, appendiceal orifice, and rectum were                            photographed. Scope In: 2:49:27 PM Scope Out: 3:05:32 PM Scope Withdrawal Time: 0 hours 13 minutes 15 seconds  Total Procedure Duration: 0 hours 16 minutes 5 seconds  Findings:                 The perianal and digital rectal examinations were                            normal.                           Two sessile polyps were found in the ascending                            colon. The polyps were 3 to 12 mm in size. These                            polyps were removed with a cold snare. Resection                            and retrieval were complete.                           A 4 mm polyp was found in the hepatic flexure. The  polyp was sessile. The polyp was removed with a                            cold snare. Resection and retrieval were complete.                           A few small-mouthed diverticula were found in the                            transverse colon and left colon.                           A 5 mm polyp was found in the sigmoid colon. The                            polyp was sessile. The polyp was removed with a                            cold snare. Resection and retrieval were complete.                           Internal hemorrhoids were found during                            retroflexion. The hemorrhoids were small.                           The exam was otherwise without abnormality. Due to                            small size of the rectum, retroflexed views were                            limited in this area. Complications:            No immediate complications.  Estimated blood loss:                            Minimal. Estimated Blood Loss:     Estimated blood loss was minimal. Impression:               - Two 3 to 12 mm polyps in the ascending colon,                            removed with a cold snare. Resected and retrieved.                           - One 4 mm polyp at the hepatic flexure, removed                            with a cold snare. Resected and retrieved.                           - Diverticulosis in the transverse  colon and in the                            left colon.                           - One 5 mm polyp in the sigmoid colon, removed with                            a cold snare. Resected and retrieved.                           - Internal hemorrhoids.                           - The examination was otherwise normal. Recommendation:           - Patient has a contact number available for                            emergencies. The signs and symptoms of potential                            delayed complications were discussed with the                            patient. Return to normal activities tomorrow.                            Written discharge instructions were provided to the                            patient.                           - Resume previous diet.                           - Continue present medications.                           - Await pathology results. Viviann Spare P. Holle Sprick, MD 04/12/2023 3:10:09 PM This report has been signed electronically.

## 2023-04-12 NOTE — Progress Notes (Signed)
Vss nad trans to pacu 

## 2023-04-12 NOTE — Progress Notes (Signed)
Called to room to assist during endoscopic procedure.  Patient ID and intended procedure confirmed with present staff. Received instructions for my participation in the procedure from the performing physician.  

## 2023-04-13 ENCOUNTER — Telehealth: Payer: Self-pay | Admitting: *Deleted

## 2023-04-13 DIAGNOSIS — D510 Vitamin B12 deficiency anemia due to intrinsic factor deficiency: Secondary | ICD-10-CM | POA: Diagnosis not present

## 2023-04-13 NOTE — Telephone Encounter (Signed)
Left message on f/u call 

## 2023-05-25 DIAGNOSIS — E538 Deficiency of other specified B group vitamins: Secondary | ICD-10-CM | POA: Diagnosis not present

## 2023-07-19 DIAGNOSIS — E538 Deficiency of other specified B group vitamins: Secondary | ICD-10-CM | POA: Diagnosis not present

## 2023-07-19 DIAGNOSIS — Z23 Encounter for immunization: Secondary | ICD-10-CM | POA: Diagnosis not present

## 2023-08-08 DIAGNOSIS — E7800 Pure hypercholesterolemia, unspecified: Secondary | ICD-10-CM | POA: Diagnosis not present

## 2023-08-08 DIAGNOSIS — D510 Vitamin B12 deficiency anemia due to intrinsic factor deficiency: Secondary | ICD-10-CM | POA: Diagnosis not present

## 2023-08-08 DIAGNOSIS — Z79899 Other long term (current) drug therapy: Secondary | ICD-10-CM | POA: Diagnosis not present

## 2023-08-08 DIAGNOSIS — M791 Myalgia, unspecified site: Secondary | ICD-10-CM | POA: Diagnosis not present

## 2023-08-08 DIAGNOSIS — Z1331 Encounter for screening for depression: Secondary | ICD-10-CM | POA: Diagnosis not present

## 2023-08-08 DIAGNOSIS — R7303 Prediabetes: Secondary | ICD-10-CM | POA: Diagnosis not present

## 2023-08-08 DIAGNOSIS — E890 Postprocedural hypothyroidism: Secondary | ICD-10-CM | POA: Diagnosis not present

## 2023-08-08 DIAGNOSIS — M2550 Pain in unspecified joint: Secondary | ICD-10-CM | POA: Diagnosis not present

## 2023-08-08 DIAGNOSIS — J439 Emphysema, unspecified: Secondary | ICD-10-CM | POA: Diagnosis not present

## 2023-08-08 DIAGNOSIS — I10 Essential (primary) hypertension: Secondary | ICD-10-CM | POA: Diagnosis not present

## 2023-08-08 DIAGNOSIS — Z9181 History of falling: Secondary | ICD-10-CM | POA: Diagnosis not present

## 2023-08-21 DIAGNOSIS — E538 Deficiency of other specified B group vitamins: Secondary | ICD-10-CM | POA: Diagnosis not present

## 2023-09-21 DIAGNOSIS — E538 Deficiency of other specified B group vitamins: Secondary | ICD-10-CM | POA: Diagnosis not present

## 2023-09-26 DIAGNOSIS — M5432 Sciatica, left side: Secondary | ICD-10-CM | POA: Diagnosis not present

## 2023-10-25 DIAGNOSIS — M4316 Spondylolisthesis, lumbar region: Secondary | ICD-10-CM | POA: Diagnosis not present

## 2023-10-25 DIAGNOSIS — M1811 Unilateral primary osteoarthritis of first carpometacarpal joint, right hand: Secondary | ICD-10-CM | POA: Diagnosis not present

## 2023-10-25 DIAGNOSIS — Z1159 Encounter for screening for other viral diseases: Secondary | ICD-10-CM | POA: Diagnosis not present

## 2023-10-25 DIAGNOSIS — M79642 Pain in left hand: Secondary | ICD-10-CM | POA: Diagnosis not present

## 2023-10-25 DIAGNOSIS — M7702 Medial epicondylitis, left elbow: Secondary | ICD-10-CM | POA: Diagnosis not present

## 2023-10-25 DIAGNOSIS — R300 Dysuria: Secondary | ICD-10-CM | POA: Diagnosis not present

## 2023-10-25 DIAGNOSIS — M19042 Primary osteoarthritis, left hand: Secondary | ICD-10-CM | POA: Diagnosis not present

## 2023-10-25 DIAGNOSIS — M545 Low back pain, unspecified: Secondary | ICD-10-CM | POA: Diagnosis not present

## 2023-10-25 DIAGNOSIS — M2550 Pain in unspecified joint: Secondary | ICD-10-CM | POA: Diagnosis not present

## 2023-10-25 DIAGNOSIS — M7701 Medial epicondylitis, right elbow: Secondary | ICD-10-CM | POA: Diagnosis not present

## 2023-10-25 DIAGNOSIS — M438X6 Other specified deforming dorsopathies, lumbar region: Secondary | ICD-10-CM | POA: Diagnosis not present

## 2023-10-25 DIAGNOSIS — R8290 Unspecified abnormal findings in urine: Secondary | ICD-10-CM | POA: Diagnosis not present

## 2023-10-25 DIAGNOSIS — M47816 Spondylosis without myelopathy or radiculopathy, lumbar region: Secondary | ICD-10-CM | POA: Diagnosis not present

## 2023-10-25 DIAGNOSIS — M79641 Pain in right hand: Secondary | ICD-10-CM | POA: Diagnosis not present

## 2023-10-25 DIAGNOSIS — R768 Other specified abnormal immunological findings in serum: Secondary | ICD-10-CM | POA: Diagnosis not present

## 2023-10-25 DIAGNOSIS — G8929 Other chronic pain: Secondary | ICD-10-CM | POA: Diagnosis not present

## 2023-10-25 DIAGNOSIS — M47817 Spondylosis without myelopathy or radiculopathy, lumbosacral region: Secondary | ICD-10-CM | POA: Diagnosis not present

## 2023-10-25 DIAGNOSIS — Z117 Encounter for testing for latent tuberculosis infection: Secondary | ICD-10-CM | POA: Diagnosis not present

## 2023-10-26 DIAGNOSIS — E538 Deficiency of other specified B group vitamins: Secondary | ICD-10-CM | POA: Diagnosis not present

## 2023-11-08 DIAGNOSIS — Z0000 Encounter for general adult medical examination without abnormal findings: Secondary | ICD-10-CM | POA: Diagnosis not present

## 2023-11-08 DIAGNOSIS — Z9181 History of falling: Secondary | ICD-10-CM | POA: Diagnosis not present

## 2023-11-08 DIAGNOSIS — Z139 Encounter for screening, unspecified: Secondary | ICD-10-CM | POA: Diagnosis not present

## 2023-11-08 DIAGNOSIS — Z1331 Encounter for screening for depression: Secondary | ICD-10-CM | POA: Diagnosis not present

## 2023-12-04 DIAGNOSIS — Z6841 Body Mass Index (BMI) 40.0 and over, adult: Secondary | ICD-10-CM | POA: Diagnosis not present

## 2023-12-04 DIAGNOSIS — M069 Rheumatoid arthritis, unspecified: Secondary | ICD-10-CM | POA: Diagnosis not present

## 2023-12-12 DIAGNOSIS — E538 Deficiency of other specified B group vitamins: Secondary | ICD-10-CM | POA: Diagnosis not present

## 2023-12-18 DIAGNOSIS — M1612 Unilateral primary osteoarthritis, left hip: Secondary | ICD-10-CM | POA: Diagnosis not present

## 2023-12-18 DIAGNOSIS — E890 Postprocedural hypothyroidism: Secondary | ICD-10-CM | POA: Diagnosis not present

## 2023-12-18 DIAGNOSIS — M7062 Trochanteric bursitis, left hip: Secondary | ICD-10-CM | POA: Diagnosis not present

## 2023-12-18 DIAGNOSIS — M545 Low back pain, unspecified: Secondary | ICD-10-CM | POA: Diagnosis not present

## 2024-01-02 DIAGNOSIS — M79605 Pain in left leg: Secondary | ICD-10-CM | POA: Diagnosis not present

## 2024-01-02 DIAGNOSIS — M7062 Trochanteric bursitis, left hip: Secondary | ICD-10-CM | POA: Diagnosis not present

## 2024-01-02 DIAGNOSIS — M1612 Unilateral primary osteoarthritis, left hip: Secondary | ICD-10-CM | POA: Diagnosis not present

## 2024-01-10 DIAGNOSIS — E538 Deficiency of other specified B group vitamins: Secondary | ICD-10-CM | POA: Diagnosis not present

## 2024-02-12 DIAGNOSIS — R7303 Prediabetes: Secondary | ICD-10-CM | POA: Diagnosis not present

## 2024-02-12 DIAGNOSIS — F419 Anxiety disorder, unspecified: Secondary | ICD-10-CM | POA: Diagnosis not present

## 2024-02-12 DIAGNOSIS — G4700 Insomnia, unspecified: Secondary | ICD-10-CM | POA: Diagnosis not present

## 2024-02-12 DIAGNOSIS — E7800 Pure hypercholesterolemia, unspecified: Secondary | ICD-10-CM | POA: Diagnosis not present

## 2024-02-12 DIAGNOSIS — J439 Emphysema, unspecified: Secondary | ICD-10-CM | POA: Diagnosis not present

## 2024-02-12 DIAGNOSIS — I10 Essential (primary) hypertension: Secondary | ICD-10-CM | POA: Diagnosis not present

## 2024-02-12 DIAGNOSIS — D510 Vitamin B12 deficiency anemia due to intrinsic factor deficiency: Secondary | ICD-10-CM | POA: Diagnosis not present

## 2024-02-12 DIAGNOSIS — E890 Postprocedural hypothyroidism: Secondary | ICD-10-CM | POA: Diagnosis not present

## 2024-02-12 DIAGNOSIS — Z79899 Other long term (current) drug therapy: Secondary | ICD-10-CM | POA: Diagnosis not present

## 2024-02-22 DIAGNOSIS — Z1231 Encounter for screening mammogram for malignant neoplasm of breast: Secondary | ICD-10-CM | POA: Diagnosis not present

## 2024-02-22 DIAGNOSIS — Z87891 Personal history of nicotine dependence: Secondary | ICD-10-CM | POA: Diagnosis not present

## 2024-02-22 DIAGNOSIS — Z122 Encounter for screening for malignant neoplasm of respiratory organs: Secondary | ICD-10-CM | POA: Diagnosis not present

## 2024-02-22 DIAGNOSIS — F17210 Nicotine dependence, cigarettes, uncomplicated: Secondary | ICD-10-CM | POA: Diagnosis not present

## 2024-03-13 DIAGNOSIS — E538 Deficiency of other specified B group vitamins: Secondary | ICD-10-CM | POA: Diagnosis not present

## 2024-04-14 DIAGNOSIS — E538 Deficiency of other specified B group vitamins: Secondary | ICD-10-CM | POA: Diagnosis not present

## 2024-05-15 DIAGNOSIS — E538 Deficiency of other specified B group vitamins: Secondary | ICD-10-CM | POA: Diagnosis not present

## 2024-06-17 DIAGNOSIS — E538 Deficiency of other specified B group vitamins: Secondary | ICD-10-CM | POA: Diagnosis not present

## 2024-07-18 DIAGNOSIS — E538 Deficiency of other specified B group vitamins: Secondary | ICD-10-CM | POA: Diagnosis not present

## 2024-08-18 DIAGNOSIS — R7303 Prediabetes: Secondary | ICD-10-CM | POA: Diagnosis not present

## 2024-08-18 DIAGNOSIS — J439 Emphysema, unspecified: Secondary | ICD-10-CM | POA: Diagnosis not present

## 2024-08-18 DIAGNOSIS — D510 Vitamin B12 deficiency anemia due to intrinsic factor deficiency: Secondary | ICD-10-CM | POA: Diagnosis not present

## 2024-08-18 DIAGNOSIS — Z79899 Other long term (current) drug therapy: Secondary | ICD-10-CM | POA: Diagnosis not present

## 2024-08-18 DIAGNOSIS — E890 Postprocedural hypothyroidism: Secondary | ICD-10-CM | POA: Diagnosis not present

## 2024-08-18 DIAGNOSIS — F17200 Nicotine dependence, unspecified, uncomplicated: Secondary | ICD-10-CM | POA: Diagnosis not present

## 2024-08-18 DIAGNOSIS — I10 Essential (primary) hypertension: Secondary | ICD-10-CM | POA: Diagnosis not present

## 2024-08-18 DIAGNOSIS — E7800 Pure hypercholesterolemia, unspecified: Secondary | ICD-10-CM | POA: Diagnosis not present

## 2024-08-18 DIAGNOSIS — Z23 Encounter for immunization: Secondary | ICD-10-CM | POA: Diagnosis not present

## 2024-08-18 DIAGNOSIS — G4700 Insomnia, unspecified: Secondary | ICD-10-CM | POA: Diagnosis not present

## 2024-09-18 DIAGNOSIS — E538 Deficiency of other specified B group vitamins: Secondary | ICD-10-CM | POA: Diagnosis not present
